# Patient Record
Sex: Female | Born: 1990 | Race: White | Hispanic: No | Marital: Single | Smoking: Former smoker
Health system: Southern US, Community
[De-identification: ages and names within clinical notes are randomized; demographics above are authoritative.]

## PROBLEM LIST (undated history)

## (undated) DIAGNOSIS — K529 Noninfective gastroenteritis and colitis, unspecified: Secondary | ICD-10-CM

## (undated) DIAGNOSIS — F410 Panic disorder [episodic paroxysmal anxiety] without agoraphobia: Secondary | ICD-10-CM

## (undated) DIAGNOSIS — K5190 Ulcerative colitis, unspecified, without complications: Secondary | ICD-10-CM

## (undated) DIAGNOSIS — A549 Gonococcal infection, unspecified: Secondary | ICD-10-CM

## (undated) DIAGNOSIS — F22 Delusional disorders: Secondary | ICD-10-CM

## (undated) DIAGNOSIS — F32A Depression, unspecified: Secondary | ICD-10-CM

## (undated) DIAGNOSIS — IMO0002 Reserved for concepts with insufficient information to code with codable children: Secondary | ICD-10-CM

## (undated) DIAGNOSIS — F329 Major depressive disorder, single episode, unspecified: Secondary | ICD-10-CM

## (undated) DIAGNOSIS — A749 Chlamydial infection, unspecified: Secondary | ICD-10-CM

## (undated) DIAGNOSIS — R87619 Unspecified abnormal cytological findings in specimens from cervix uteri: Secondary | ICD-10-CM

## (undated) DIAGNOSIS — R569 Unspecified convulsions: Secondary | ICD-10-CM

## (undated) DIAGNOSIS — B977 Papillomavirus as the cause of diseases classified elsewhere: Secondary | ICD-10-CM

## (undated) DIAGNOSIS — F1910 Other psychoactive substance abuse, uncomplicated: Secondary | ICD-10-CM

---
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
## 2009-03-04 DIAGNOSIS — K529 Noninfective gastroenteritis and colitis, unspecified: Secondary | ICD-10-CM

## 2009-03-04 HISTORY — DX: Noninfective gastroenteritis and colitis, unspecified: K52.9

## 2009-12-02 HISTORY — PX: COLONOSCOPY: SHX174

## 2010-05-16 ENCOUNTER — Emergency Department (HOSPITAL_COMMUNITY)
Admission: EM | Admit: 2010-05-16 | Discharge: 2010-05-17 | Disposition: A | Discharge status: Home / Self Care | Payer: Self-pay | Attending: Emergency Medicine | Admitting: Emergency Medicine

## 2010-05-16 DIAGNOSIS — F17200 Nicotine dependence, unspecified, uncomplicated: Secondary | ICD-10-CM | POA: Insufficient documentation

## 2010-05-16 DIAGNOSIS — Z3201 Encounter for pregnancy test, result positive: Secondary | ICD-10-CM | POA: Insufficient documentation

## 2010-05-17 LAB — POCT PREGNANCY, URINE: Preg Test, Ur: POSITIVE

## 2010-12-27 LAB — STREP B DNA PROBE: GBS: NEGATIVE

## 2011-01-28 ENCOUNTER — Inpatient Hospital Stay (HOSPITAL_COMMUNITY)
Admission: AD | Admit: 2011-01-28 | Discharge: 2011-01-31 | DRG: 774 | Disposition: A | Discharge status: Home / Self Care | Payer: Medicaid Other | Source: Ambulatory Visit | Attending: Obstetrics and Gynecology | Admitting: Obstetrics and Gynecology

## 2011-01-28 ENCOUNTER — Encounter (HOSPITAL_COMMUNITY): Payer: Self-pay | Admitting: *Deleted

## 2011-01-28 ENCOUNTER — Encounter (HOSPITAL_COMMUNITY): Payer: Self-pay | Admitting: Anesthesiology

## 2011-01-28 ENCOUNTER — Inpatient Hospital Stay (HOSPITAL_COMMUNITY): Payer: Medicaid Other | Admitting: Anesthesiology

## 2011-01-28 DIAGNOSIS — F1290 Cannabis use, unspecified, uncomplicated: Secondary | ICD-10-CM | POA: Diagnosis not present

## 2011-01-28 DIAGNOSIS — O480 Post-term pregnancy: Principal | ICD-10-CM | POA: Diagnosis present

## 2011-01-28 DIAGNOSIS — F1911 Other psychoactive substance abuse, in remission: Secondary | ICD-10-CM

## 2011-01-28 DIAGNOSIS — F4310 Post-traumatic stress disorder, unspecified: Secondary | ICD-10-CM | POA: Diagnosis present

## 2011-01-28 DIAGNOSIS — O365990 Maternal care for other known or suspected poor fetal growth, unspecified trimester, not applicable or unspecified: Secondary | ICD-10-CM | POA: Diagnosis present

## 2011-01-28 DIAGNOSIS — F32A Depression, unspecified: Secondary | ICD-10-CM | POA: Diagnosis not present

## 2011-01-28 DIAGNOSIS — F329 Major depressive disorder, single episode, unspecified: Secondary | ICD-10-CM | POA: Diagnosis not present

## 2011-01-28 DIAGNOSIS — F17210 Nicotine dependence, cigarettes, uncomplicated: Secondary | ICD-10-CM | POA: Diagnosis present

## 2011-01-28 DIAGNOSIS — Z915 Personal history of self-harm: Secondary | ICD-10-CM

## 2011-01-28 DIAGNOSIS — F419 Anxiety disorder, unspecified: Secondary | ICD-10-CM | POA: Diagnosis present

## 2011-01-28 DIAGNOSIS — Z9151 Personal history of suicidal behavior: Secondary | ICD-10-CM

## 2011-01-28 HISTORY — DX: Major depressive disorder, single episode, unspecified: F32.9

## 2011-01-28 HISTORY — DX: Papillomavirus as the cause of diseases classified elsewhere: B97.7

## 2011-01-28 HISTORY — DX: Reserved for concepts with insufficient information to code with codable children: IMO0002

## 2011-01-28 HISTORY — DX: Noninfective gastroenteritis and colitis, unspecified: K52.9

## 2011-01-28 HISTORY — DX: Unspecified abnormal cytological findings in specimens from cervix uteri: R87.619

## 2011-01-28 HISTORY — DX: Other psychoactive substance abuse, uncomplicated: F19.10

## 2011-01-28 HISTORY — DX: Delusional disorders: F22

## 2011-01-28 HISTORY — DX: Chlamydial infection, unspecified: A74.9

## 2011-01-28 HISTORY — DX: Unspecified convulsions: R56.9

## 2011-01-28 HISTORY — DX: Depression, unspecified: F32.A

## 2011-01-28 HISTORY — DX: Gonococcal infection, unspecified: A54.9

## 2011-01-28 HISTORY — DX: Panic disorder (episodic paroxysmal anxiety): F41.0

## 2011-01-28 LAB — COMPREHENSIVE METABOLIC PANEL
Alkaline Phosphatase: 176 U/L — ABNORMAL HIGH (ref 39–117)
BUN: 9 mg/dL (ref 6–23)
Chloride: 99 mEq/L (ref 96–112)
GFR calc Af Amer: >90 mL/min (ref >90)
Glucose, Bld: 85 mg/dL (ref 70–99)
Potassium: 3.7 mEq/L (ref 3.5–5.1)
Total Bilirubin: 0.5 mg/dL (ref 0.3–1.2)
Total Protein: 6.4 g/dL (ref 6.0–8.3)

## 2011-01-28 LAB — RAPID URINE DRUG SCREEN, HOSP PERFORMED
Benzodiazepines: NOT DETECTED
Cocaine: NOT DETECTED
Opiates: NOT DETECTED
Tetrahydrocannabinol: NOT DETECTED

## 2011-01-28 LAB — CBC
HCT: 34.6 % — ABNORMAL LOW (ref 36.0–46.0)
Hemoglobin: 12 g/dL (ref 12.0–15.0)
MCHC: 34.7 g/dL (ref 30.0–36.0)
MCV: 87.4 fL (ref 78.0–100.0)

## 2011-01-28 LAB — POCT FERN TEST: Fern Test: NEGATIVE

## 2011-01-28 LAB — LACTATE DEHYDROGENASE: LDH: 168 U/L (ref 94–250)

## 2011-01-28 LAB — RPR: RPR Ser Ql: NONREACTIVE

## 2011-01-28 MED ORDER — CITRIC ACID-SODIUM CITRATE 334-500 MG/5ML PO SOLN: 30.0000 mL | ORAL | Status: DC | PRN

## 2011-01-28 MED ORDER — PHENYLEPHRINE 40 MCG/ML (10ML) SYRINGE FOR IV PUSH (FOR BLOOD PRESSURE SUPPORT): 80.0000 ug | PREFILLED_SYRINGE | INTRAVENOUS | Status: DC | PRN

## 2011-01-28 MED ORDER — EPHEDRINE 5 MG/ML INJ
10.0000 mg | INTRAVENOUS | Status: DC | PRN
  Filled 2011-01-28: qty 4

## 2011-01-28 MED ORDER — OXYTOCIN 20 UNITS IN LACTATED RINGERS INFUSION - SIMPLE
1.0000 m[IU]/min | INTRAVENOUS | Status: DC
  Administered 2011-01-28: 1 m[IU]/min via INTRAVENOUS
  Filled 2011-01-28: qty 1000

## 2011-01-28 MED ORDER — LACTATED RINGERS IV SOLN
500.0000 mL | Freq: Once | INTRAVENOUS | Status: AC
  Administered 2011-01-28: 500 mL via INTRAVENOUS

## 2011-01-28 MED ORDER — LACTATED RINGERS IV SOLN
500.0000 mL | INTRAVENOUS | Status: DC | PRN
Start: 2011-01-28 — End: 2011-01-29

## 2011-01-28 MED ORDER — FLEET ENEMA 7-19 GM/118ML RE ENEM: 1.0000 | ENEMA | RECTAL | Status: DC | PRN

## 2011-01-28 MED ORDER — IBUPROFEN 600 MG PO TABS
600.0000 mg | ORAL_TABLET | Freq: Four times a day (QID) | ORAL | Status: DC | PRN
  Administered 2011-01-29: 600 mg via ORAL
  Filled 2011-01-28 (×6): qty 1

## 2011-01-28 MED ORDER — FENTANYL 2.5 MCG/ML BUPIVACAINE 1/10 % EPIDURAL INFUSION (WH - ANES)
14.0000 mL/h | INTRAMUSCULAR | Status: DC
  Administered 2011-01-28 (×3): 14 mL/h via EPIDURAL
  Filled 2011-01-28 (×3): qty 60

## 2011-01-28 MED ORDER — TERBUTALINE SULFATE 1 MG/ML IJ SOLN: 0.2500 mg | Freq: Once | INTRAMUSCULAR | Status: AC | PRN

## 2011-01-28 MED ORDER — LACTATED RINGERS IV SOLN
INTRAVENOUS | Status: DC
  Administered 2011-01-28: 125 mL/h via INTRAVENOUS
  Administered 2011-01-28: 21:00:00 via INTRAVENOUS
  Administered 2011-01-29: 300 mL via INTRAVENOUS

## 2011-01-28 MED ORDER — ACETAMINOPHEN 325 MG PO TABS
650.0000 mg | ORAL_TABLET | ORAL | Status: DC | PRN
  Administered 2011-01-29: 650 mg via ORAL
  Filled 2011-01-28: qty 2

## 2011-01-28 MED ORDER — LIDOCAINE HCL 1.5 % IJ SOLN
INTRAMUSCULAR | Status: DC | PRN
  Administered 2011-01-28 (×2): 5 mL via EPIDURAL

## 2011-01-28 MED ORDER — LIDOCAINE HCL (PF) 1 % IJ SOLN
30.0000 mL | INTRAMUSCULAR | Status: DC | PRN
  Filled 2011-01-28: qty 30

## 2011-01-28 MED ORDER — OXYTOCIN BOLUS FROM INFUSION
500.0000 mL | Freq: Once | INTRAVENOUS | Status: DC
  Filled 2011-01-28: qty 500

## 2011-01-28 MED ORDER — ZOLPIDEM TARTRATE 10 MG PO TABS: 10.0000 mg | ORAL_TABLET | Freq: Every evening | ORAL | Status: DC | PRN

## 2011-01-28 MED ORDER — EPHEDRINE 5 MG/ML INJ: 10.0000 mg | INTRAVENOUS | Status: DC | PRN

## 2011-01-28 MED ORDER — OXYCODONE-ACETAMINOPHEN 5-325 MG PO TABS: 2.0000 | ORAL_TABLET | ORAL | Status: DC | PRN

## 2011-01-28 MED ORDER — ONDANSETRON HCL 4 MG/2ML IJ SOLN
4.0000 mg | Freq: Four times a day (QID) | INTRAMUSCULAR | Status: DC | PRN
  Administered 2011-01-28: 4 mg via INTRAVENOUS
  Filled 2011-01-28: qty 2

## 2011-01-28 MED ORDER — PHENYLEPHRINE 40 MCG/ML (10ML) SYRINGE FOR IV PUSH (FOR BLOOD PRESSURE SUPPORT)
80.0000 ug | PREFILLED_SYRINGE | INTRAVENOUS | Status: DC | PRN
  Filled 2011-01-28: qty 5

## 2011-01-28 MED ORDER — DIPHENHYDRAMINE HCL 50 MG/ML IJ SOLN: 12.5000 mg | INTRAMUSCULAR | Status: DC | PRN

## 2011-01-28 MED ORDER — OXYTOCIN 20 UNITS IN LACTATED RINGERS INFUSION - SIMPLE: 125.0000 mL/h | Freq: Once | INTRAVENOUS | Status: DC

## 2011-01-28 MED ORDER — BUTORPHANOL TARTRATE 2 MG/ML IJ SOLN
1.0000 mg | INTRAMUSCULAR | Status: DC | PRN
  Administered 2011-01-28: 1 mg via INTRAVENOUS
  Filled 2011-01-28: qty 1

## 2011-01-28 NOTE — Anesthesia Procedure Notes (Signed)
Epidural Patient location during procedure: OB Start time: 01/28/2011 5:38 PM  Staffing Performed by: anesthesiologist   Preanesthetic Checklist Completed: patient identified, site marked, surgical consent, pre-op evaluation, timeout performed, IV checked, risks and benefits discussed and monitors and equipment checked  Epidural Patient position: sitting Prep: site prepped and draped and DuraPrep Patient monitoring: continuous pulse ox and blood pressure Approach: midline Injection technique: LOR air  Needle:  Needle type: Tuohy  Needle gauge: 17 G Needle length: 9 cm Needle insertion depth: 5 cm cm Catheter type: closed end flexible Catheter size: 19 Gauge Catheter at skin depth: 10 cm Test dose: negative  Assessment Events: blood not aspirated, injection not painful, no injection resistance, negative IV test and no paresthesia  Additional Notes Patient identified.  Risk benefits discussed including failed block, incomplete pain control, headache, nerve damage, paralysis, blood pressure changes, nausea, vomiting, reactions to medication both toxic or allergic, and postpartum back pain.  Patient expressed understanding and wished to proceed.  All questions were answered.  Sterile technique used throughout procedure and epidural site dressed with sterile barrier dressing. No paresthesia or other complications noted.The patient did not experience any signs of intravascular injection such as tinnitus or metallic taste in mouth nor signs of intrathecal spread such as rapid motor block. Please see nursing notes for vital signs.

## 2011-01-28 NOTE — Progress Notes (Signed)
  Subjective: Feeling some pressure with some contractions.  Partner still not here, but patient anticipates his arrival soon.  Objective: BP 105/71  Pulse 107  Temp(Src) 98.7 F (37.1 C) (Oral)  Resp 18  Ht 5' 5.34" (1.66 m)  Wt 67.132 kg (148 lb)  BMI 24.37 kg/m2  SpO2 100% I/O last 3 completed shifts: In: -  Out: 50 [Urine:50]    FHT:  FHR: 150s bpm, variability: moderate,  accelerations:  Present,  decelerations:  Present Occasional mild variable. UC:   regular, every 2 minutes, MVUs 190-225.  Pitocin on 1 mu/min SVE:   Dilation: Lip/rim Effacement (%): 100 Station: +2;+1 Exam by:: V. Lathema CNM Cervical rim on right No descent/reduction with trial push--patient not feeling much pressure at present.  Assessment / Plan: Progressing well--will await greater urge to push.  Nigel Bridgeman 01/28/2011, 10:52 PM
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
## 2011-01-28 NOTE — Progress Notes (Signed)
  Subjective: SROM at approx. 8:30p, with moderate MSF noted.  Variables with UCs.  No pain or pressure.  Objective: BP 98/44  Pulse 82  Temp(Src) 98 F (36.7 C) (Oral)  Resp 18  Ht 5' 5.34" (1.66 m)  Wt 67.132 kg (148 lb)  BMI 24.37 kg/m2  SpO2 100% I/O last 3 completed shifts: In: -  Out: 50 [Urine:50]    FHT:  FHR: 140s bpm, variability: moderate,  accelerations:  Present,  decelerations:  Present Mild variables with UCs.  Accelerations noted with scalp stim.  UC:   regular, every 2 minutes SVE:   7-8 cm, 100%, vtx +1 station Leaking moderate MSF  IUPC and FSE applied  Pitocin on 6 mu/min  Labs: Lab Results  Component Value Date   WBC 13.4* 01/28/2011   HGB 12.0 01/28/2011   HCT 34.6* 01/28/2011   MCV 87.4 01/28/2011   PLT 235 01/28/2011    Assessment / Plan: Augmentation of labor, progressing well Series of variable decels  Will observe tracing--if variables persist, will plan amnioinfusion. Pitocin down to 3 mu/min.  Joanne Newman 01/28/2011, 9:05 PM
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
## 2011-01-28 NOTE — Progress Notes (Signed)
  Subjective: Comfortable after epidural.  Had received IV Stadol with minimal benefit.  Objective: BP 117/79  Pulse 91  Temp(Src) 98 F (36.7 C) (Oral)  Resp 18  Ht 5' 5.34" (1.66 m)  Wt 67.132 kg (148 lb)  BMI 24.37 kg/m2  SpO2 100% I/O last 3 completed shifts: In: -  Out: 50 [Urine:50]    FHT:  FHR: 140s bpm, variability: moderate,  accelerations:  Present,  decelerations:  Absent UC:   regular, every 3-4 minutes SVE:   Dilation: 6 Effacement (%): 80 Station: -1 Exam by:: Manfred Arch, CNM Pitocin on 6 mu/min  Labs: Lab Results  Component Value Date   WBC 13.4* 01/28/2011   HGB 12.0 01/28/2011   HCT 34.6* 01/28/2011   MCV 87.4 01/28/2011   PLT 235 01/28/2011    Assessment / Plan: Augmentation of labor, progressing well Category 1 FHR  FOB not present--patient advises he is working till 10 pm. Will defer AROM until he arrives.  Advised patient SROM may occur, with natural labor progression as potential outcome.  Patient agreeable with plan.   Nigel Bridgeman 01/28/2011, 7:04 PM

## 2011-01-28 NOTE — Progress Notes (Signed)
Leaking first noted at 0830, blood tinged... Dingy/clear. Continues to leak, lower abd cramping since

## 2011-01-29 ENCOUNTER — Encounter (HOSPITAL_COMMUNITY): Payer: Self-pay | Admitting: *Deleted

## 2011-01-29 DIAGNOSIS — F17210 Nicotine dependence, cigarettes, uncomplicated: Secondary | ICD-10-CM | POA: Diagnosis present

## 2011-01-29 DIAGNOSIS — F4310 Post-traumatic stress disorder, unspecified: Secondary | ICD-10-CM | POA: Diagnosis present

## 2011-01-29 DIAGNOSIS — Z9151 Personal history of suicidal behavior: Secondary | ICD-10-CM

## 2011-01-29 DIAGNOSIS — F419 Anxiety disorder, unspecified: Secondary | ICD-10-CM | POA: Diagnosis present

## 2011-01-29 DIAGNOSIS — F329 Major depressive disorder, single episode, unspecified: Secondary | ICD-10-CM | POA: Diagnosis not present

## 2011-01-29 DIAGNOSIS — F1290 Cannabis use, unspecified, uncomplicated: Secondary | ICD-10-CM | POA: Diagnosis not present

## 2011-01-29 DIAGNOSIS — Z915 Personal history of self-harm: Secondary | ICD-10-CM

## 2011-01-29 MED ORDER — KETOROLAC TROMETHAMINE 30 MG/ML IJ SOLN: 30.0000 mg | Freq: Four times a day (QID) | INTRAMUSCULAR | Status: DC | PRN

## 2011-01-29 MED ORDER — METHYLERGONOVINE MALEATE 0.2 MG/ML IJ SOLN: 0.2000 mg | INTRAMUSCULAR | Status: DC | PRN

## 2011-01-29 MED ORDER — MAGNESIUM HYDROXIDE 400 MG/5ML PO SUSP: 30.0000 mL | ORAL | Status: DC | PRN

## 2011-01-29 MED ORDER — ZOLPIDEM TARTRATE 5 MG PO TABS: 5.0000 mg | ORAL_TABLET | Freq: Every evening | ORAL | Status: DC | PRN

## 2011-01-29 MED ORDER — IBUPROFEN 600 MG PO TABS
600.0000 mg | ORAL_TABLET | Freq: Four times a day (QID) | ORAL | Status: DC
  Administered 2011-01-29 – 2011-01-31 (×7): 600 mg via ORAL
  Filled 2011-01-29 (×2): qty 1

## 2011-01-29 MED ORDER — TRAMADOL HCL 50 MG PO TABS
100.0000 mg | ORAL_TABLET | Freq: Four times a day (QID) | ORAL | Status: DC | PRN
  Administered 2011-01-29 – 2011-01-30 (×2): 100 mg via ORAL
  Filled 2011-01-29 (×2): qty 2

## 2011-01-29 MED ORDER — LANOLIN HYDROUS EX OINT: TOPICAL_OINTMENT | CUTANEOUS | Status: DC | PRN

## 2011-01-29 MED ORDER — ONDANSETRON HCL 4 MG/2ML IJ SOLN: 4.0000 mg | INTRAMUSCULAR | Status: DC | PRN

## 2011-01-29 MED ORDER — ONDANSETRON HCL 4 MG PO TABS: 4.0000 mg | ORAL_TABLET | ORAL | Status: DC | PRN

## 2011-01-29 MED ORDER — WITCH HAZEL-GLYCERIN EX PADS: 1.0000 "application " | MEDICATED_PAD | CUTANEOUS | Status: DC | PRN

## 2011-01-29 MED ORDER — PRENATAL PLUS 27-1 MG PO TABS
1.0000 | ORAL_TABLET | Freq: Every day | ORAL | Status: DC
  Administered 2011-01-30 – 2011-01-31 (×2): 1 via ORAL

## 2011-01-29 MED ORDER — DIPHENHYDRAMINE HCL 25 MG PO CAPS: 25.0000 mg | ORAL_CAPSULE | Freq: Four times a day (QID) | ORAL | Status: DC | PRN

## 2011-01-29 MED ORDER — BENZOCAINE-MENTHOL 20-0.5 % EX AERO
INHALATION_SPRAY | CUTANEOUS | Status: AC
  Filled 2011-01-29: qty 56

## 2011-01-29 MED ORDER — MEDROXYPROGESTERONE ACETATE 150 MG/ML IM SUSP: 150.0000 mg | INTRAMUSCULAR | Status: DC | PRN

## 2011-01-29 MED ORDER — OXYCODONE-ACETAMINOPHEN 5-325 MG PO TABS
1.0000 | ORAL_TABLET | Freq: Once | ORAL | Status: AC
  Administered 2011-01-29: 1 via ORAL
  Filled 2011-01-29: qty 1

## 2011-01-29 MED ORDER — PRENATAL PLUS 27-1 MG PO TABS
1.0000 | ORAL_TABLET | Freq: Every day | ORAL | Status: DC
  Administered 2011-01-29 – 2011-01-30 (×2): 1 via ORAL
  Filled 2011-01-29 (×5): qty 1

## 2011-01-29 MED ORDER — BENZOCAINE-MENTHOL 20-0.5 % EX AERO
1.0000 "application " | INHALATION_SPRAY | CUTANEOUS | Status: DC | PRN
  Administered 2011-01-29: 1 via TOPICAL

## 2011-01-29 MED ORDER — SIMETHICONE 80 MG PO CHEW: 80.0000 mg | CHEWABLE_TABLET | ORAL | Status: DC | PRN

## 2011-01-29 MED ORDER — SENNOSIDES-DOCUSATE SODIUM 8.6-50 MG PO TABS
2.0000 | ORAL_TABLET | Freq: Every day | ORAL | Status: DC
  Administered 2011-01-29 – 2011-01-30 (×2): 2 via ORAL

## 2011-01-29 MED ORDER — BENZOCAINE-MENTHOL 20-0.5 % EX AERO
INHALATION_SPRAY | CUTANEOUS | Status: AC
  Administered 2011-01-29: 1 via TOPICAL
  Filled 2011-01-29: qty 56

## 2011-01-29 MED ORDER — DIBUCAINE 1 % RE OINT: 1.0000 "application " | TOPICAL_OINTMENT | RECTAL | Status: DC | PRN

## 2011-01-29 MED ORDER — METHYLERGONOVINE MALEATE 0.2 MG PO TABS: 0.2000 mg | ORAL_TABLET | ORAL | Status: DC | PRN

## 2011-01-29 MED ORDER — TETANUS-DIPHTH-ACELL PERTUSSIS 5-2.5-18.5 LF-MCG/0.5 IM SUSP
0.5000 mL | Freq: Once | INTRAMUSCULAR | Status: AC
  Administered 2011-01-30: 0.5 mL via INTRAMUSCULAR
  Filled 2011-01-29: qty 0.5

## 2011-01-29 NOTE — Anesthesia Postprocedure Evaluation (Signed)
Anesthesia Post Note  Patient: Joanne Newman  Procedure(s) Performed: * No procedures listed *  Anesthesia type: Epidural  Patient location: Mother/Baby  Post pain: Pain level controlled  Post assessment: Post-op Vital signs reviewed  Last Vitals:  Filed Vitals:   01/29/11 0900  BP: 112/71  Pulse: 81  Temp: 36.6 C  Resp: 18    Post vital signs: Reviewed  Level of consciousness: awake  Complications: No apparent anesthesia complications

## 2011-01-29 NOTE — Progress Notes (Signed)
Called by RN requesting stronger medication for uterine cramping while nursing.  Pt last had iburprofen at 12noon.  She has no narcotic medication ordered presumably because of substance abuse history. Plan:   Single dose Percocet now. Begin Toradol 30 mg IV every 6 hours starting 6 hours after last ibuprofen. Both orders placed in computer.

## 2011-01-29 NOTE — Progress Notes (Signed)
Patient ID: Joanne Newman, female   DOB: Sep 23, 1990, 20 y.o.   MRN: [id number] Post Partum Day 0 Subjective: no complaints, up ad lib without syncope, voiding, tolerating PO, + flatus  Pain well controlled with po meds, has some mild cramping BF initiated  Mood stable, bonding well, baby currently skin-skin   Objective: Blood pressure 112/71, pulse 81, temperature 97.9 F (36.6 C), temperature source Oral, resp. rate 18, height 5' 5.34" (1.66 m), weight 67.132 kg (148 lb), SpO2 98.00%, unknown if currently breastfeeding.  Physical Exam:  General: alert and no distress Lungs: CTAB Heart: RRR Breasts: soft, nipples intact Lochia: appropriate Uterine Fundus: firm Perineum: wnl DVT Evaluation: No evidence of DVT seen on physical exam. Negative Homan's sign. No significant calf/ankle edema.   Basename 01/28/11 1233  HGB 12.0  HCT 34.6*    Assessment/Plan: Breastfeeding, Lactation consult, Social Work consult and Contraception pt declines any contraception, emphasized risk of pregnancy states she will practice abstinece       LOS: 1 day   Joanne Newman M 01/29/2011, 10:40 AM

## 2011-01-29 NOTE — Progress Notes (Signed)
Delivery Note Pt was C/C/+2 at 1150.  Pushed w/ 2 ctxs and no progress w/ pushes, so labored down until just before 0130.  Once pushing resumed, pt's temp noted to be 100.2 axillary.  Given IVF bolus, along with po Tylenol, however, within a few minutes of taking, pt threw up.  Pushed well thereafter (left side-lying), and at 1:48 AM a viable female "Hessie Diener" was delivered via Vaginal, Spontaneous Delivery (Presentation:LOA ;  ).  APGAR: 9, 9; weight 7 lb 6.3 oz (3355 g).   Placenta status: Intact, Spontaneous.  Cord: 3 vessels with the following complications: None.  Cord pH: n/a Newborn w/ occult cord at neck, and body cord.  Spontaneous cry as delivered and placed on mom's abdomen.  FOB was present and cut cord.   Anesthesia: Epidural  Episiotomy: None Lacerations: hemostatic bilateral superficial labial Suture Repair: n/a Est. Blood Loss (mL):  Mom to postpartum.  Baby to nursery-stable. Pt plans to breastfeed.  Will CTO mom's temp.  Plan PP SW consult.   Ilaria Much H 01/29/2011, 2:12 AM

## 2011-01-29 NOTE — Progress Notes (Signed)
UR chart review completed.  

## 2011-01-30 LAB — CBC
Hemoglobin: 10.9 g/dL — ABNORMAL LOW (ref 12.0–15.0)
MCH: 29.8 pg (ref 26.0–34.0)
MCV: 88.8 fL (ref 78.0–100.0)
RBC: 3.66 MIL/uL — ABNORMAL LOW (ref 3.87–5.11)

## 2011-01-30 MED ORDER — MEASLES, MUMPS & RUBELLA VAC ~~LOC~~ INJ
0.5000 mL | INJECTION | Freq: Once | SUBCUTANEOUS | Status: AC
  Administered 2011-01-31: 0.5 mL via SUBCUTANEOUS
  Filled 2011-01-30 (×2): qty 0.5
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
## 2011-01-30 NOTE — Progress Notes (Addendum)
Post Partum Day 1 Subjective: up ad lib and voiding  Objective: Blood pressure 118/65, pulse 86, temperature 98.1 F (36.7 C), temperature source Oral, resp. rate 18, height 5' 5.34" (1.66 m), weight 67.132 kg (148 lb), SpO2 98.00%, unknown if currently breastfeeding.  Physical Exam:  General: alert Lochia: small serosa Uterine Fundus: WNL per nurse exam Incision: na DVT Evaluation: - Homans sign bilaterally   Basename 01/30/11 0540 01/28/11 1233  HGB 10.9* 12.0  HCT 32.5* 34.6*    Assessment/Plan: Plan for discharge tomorrow, Breastfeeding, Social Work consult and Contraception plans condoms   LOS: 2 days   Joanne Newman 01/30/2011, 10:33 AM

## 2011-01-31 DIAGNOSIS — F1911 Other psychoactive substance abuse, in remission: Secondary | ICD-10-CM

## 2011-01-31 MED ORDER — IBUPROFEN 600 MG PO TABS: 600.0000 mg | ORAL_TABLET | Freq: Four times a day (QID) | ORAL | Status: AC | PRN

## 2011-01-31 NOTE — Discharge Summary (Signed)
   Obstetric Discharge Summary Reason for Admission: onset of labor and post dates Prenatal Procedures: NST and ultrasound Intrapartum Procedures: spontaneous vaginal delivery and epidural Postpartum Procedures: none Complications-Operative and Postpartum: none  Temp:  [98.1 F (36.7 C)-98.2 F (36.8 C)] 98.2 F (36.8 C) (11/29 0600) Pulse Rate:  [67-80] 67  (11/29 0600) Resp:  [18] 18  (11/29 0600) BP: (115-118)/(65-77) 115/73 mmHg (11/29 0600) Hemoglobin  Date Value Range Status  01/30/2011 10.9* 12.0-15.0 (g/dL) Final     HCT  Date Value Range Status  01/30/2011 32.5* 36.0-46.0 (%) Final    Hospital Course:  Pt arrived in early labor and postdates, She had some initial borderline BP, with no PIH sx's, PIH labs were normal.  She was admitted and started on pitocin, received 1 dose of stadol and then received an epidural. She progressed, and SROM with MSF and had variables, overall FHR remained reassuring and had SVD, there were bilateral superficial labial lacerations that were hemostatic and therefore not repaired. She then had a normal PP course, with a SW consult and deemed stable. An initial UDS performed on arrival was negative.   Discharge Diagnoses: Term Pregnancy-delivered  Discharge Information: Date: 01/31/2011 Activity: pelvic rest Diet: routine Medications:  Medication List  As of 01/31/2011  8:54 AM   START taking these medications         ibuprofen 600 MG tablet   Commonly known as: ADVIL,MOTRIN   Take 1 tablet (600 mg total) by mouth every 6 (six) hours as needed for pain (pain scale < 4).         CONTINUE taking these medications         prenatal vitamin w/FE, FA 27-1 MG Tabs          Where to get your medications    These are the prescriptions that you need to pick up.   You may get these medications from any pharmacy.         ibuprofen 600 MG tablet           Condition: stable Instructions: refer to practice specific booklet Discharge  to: home Follow-up Information    Follow up with Janine Limbo, MD in 6 weeks. (As needed if symptoms worsen)    Contact information:   [address] [place] [address] [phone]          Newborn Data: Live born  Information for the patient's newborn:  Auriel, Kist Girl Kween [[id number]]  female ; APGAR , 9, 9 ; weight ; 7#6oz  Home with mother.  Delmas Faucett M 01/31/2011, 8:54 AM

## 2011-01-31 NOTE — Progress Notes (Signed)
Patient ID: Joanne Newman, female   DOB: [birth year], 20 y.o.   MRN: [id number] Post Partum Day 2 Subjective: no complaints, up ad lib without syncope, voiding, tolerating PO, + flatus, no BM Pain well controlled with po meds, had to have a dose of percocet and switched to toradol for increased cramping, better today, c/o feeling vaginal soreness BF well, cluster feeding still Mood stable, no SI/HI bonding well   Objective: Blood pressure 115/73, pulse 67, temperature 98.2 F (36.8 C), temperature source Oral, resp. rate 18, height 5' 5.34" (1.66 m), weight 67.132 kg (148 lb), SpO2 98.00%, unknown if currently breastfeeding.  Physical Exam:  General: alert, fatigued and no distress Lungs: CTAB Heart: RRR Breasts: filling, nipples intact Lochia: appropriate Uterine Fundus: firm Perineum: WNL, slightly swolen DVT Evaluation: No evidence of DVT seen on physical exam. Negative Homan's sign. No significant calf/ankle edema.   Basename 01/30/11 0540 01/28/11 1233  HGB 10.9* 12.0  HCT 32.5* 34.6*    Assessment/Plan: Discharge home, Breastfeeding, Lactation consult and Contraception condoms and abstinence, again reinforced risk of pregnancy       LOS: 3 days   Cline Draheim M 01/31/2011, 9:03 AM

## 2011-10-19 ENCOUNTER — Emergency Department (HOSPITAL_COMMUNITY)
Admission: EM | Admit: 2011-10-19 | Discharge: 2011-10-19 | Disposition: A | Discharge status: Home / Self Care | Payer: Self-pay | Attending: Emergency Medicine | Admitting: Emergency Medicine

## 2011-10-19 ENCOUNTER — Encounter (HOSPITAL_COMMUNITY): Payer: Self-pay | Admitting: *Deleted

## 2011-10-19 DIAGNOSIS — N390 Urinary tract infection, site not specified: Secondary | ICD-10-CM | POA: Insufficient documentation

## 2011-10-19 DIAGNOSIS — Z87891 Personal history of nicotine dependence: Secondary | ICD-10-CM | POA: Insufficient documentation

## 2011-10-19 LAB — URINALYSIS, ROUTINE W REFLEX MICROSCOPIC
Bilirubin Urine: NEGATIVE
Glucose, UA: NEGATIVE mg/dL
Hgb urine dipstick: NEGATIVE
Ketones, ur: NEGATIVE mg/dL
pH: 5.5 (ref 5.0–8.0)

## 2011-10-19 LAB — URINE MICROSCOPIC-ADD ON

## 2011-10-19 MED ORDER — NITROFURANTOIN MONOHYD MACRO 100 MG PO CAPS: 100.0000 mg | ORAL_CAPSULE | Freq: Two times a day (BID) | ORAL | Status: AC

## 2011-10-19 MED ORDER — PHENAZOPYRIDINE HCL 200 MG PO TABS: 200.0000 mg | ORAL_TABLET | Freq: Three times a day (TID) | ORAL | Status: AC

## 2011-10-19 NOTE — ED Notes (Signed)
Patient is breastfeeding

## 2011-10-19 NOTE — ED Provider Notes (Signed)
History     CSN: [id number]  Arrival date & time 10/19/11  1531   First MD Initiated Contact with Patient 10/19/11 [phone]      Chief Complaint  Patient presents with  . Urinary Frequency  . Dysuria    (Consider location/radiation/quality/duration/timing/severity/associated sxs/prior treatment) HPI  Patient presents to the ER with complaints of urinary urgency and frequency. The symptoms have been consistent for 3-4 days . She denies having any abdominal pains of back pains, vaginal discharge, hematuria, diarrhea, fevers, chills, nausea, vomiting. She admits to having had UTIs in the past. VSS and NAD. She is allergic to keflex and penicillins, she also has a approx 34 month old that she is breast feeding.  Past Medical History  Diagnosis Date  . Seizures   . Abnormal Pap smear   . HPV in female   . Chlamydia 2010  . Gonorrhea 2010  . Depression   . Paranoia   . Panic attacks   . Colitis 03-2009  . Substance abuse   . Cystitis     Past Surgical History  Procedure Date  . Colonoscopy 12-2009    Family History  Problem Relation Age of Onset  . Anesthesia problems Neg Hx     History  Substance Use Topics  . Smoking status: Former Smoker -- 0.5 packs/day for 10 years    Types: Cigarettes    Quit date: 07/28/2010  . Smokeless tobacco: Never Used  . Alcohol Use: Yes     3-4 per week    OB History    Grav Para Term Preterm Abortions TAB SAB Ect Mult Living   3 1 1  0 2 0 2 0 0 1      Review of Systems   HEENT: denies blurry vision or change in hearing PULMONARY: Denies difficulty breathing and SOB CARDIAC: denies chest pain or heart palpitations MUSCULOSKELETAL:  denies being unable to ambulate ABDOMEN AL: denies abdominal pain GU: denies loss of bowel or urinary control NEURO: denies numbness and tingling in extremities SKIN: no new rashes PSYCH: patient denies anxiety or depression. NECK: Pt denies having neck pain     Allergies  Cephalexin; Cheese  flavor; Penicillins; Vicodin; and Latex  Home Medications   Current Outpatient Rx  Name Route Sig Dispense Refill  . NITROFURANTOIN MONOHYD MACRO 100 MG PO CAPS Oral Take 1 capsule (100 mg total) by mouth 2 (two) times daily. 10 capsule 0  . PHENAZOPYRIDINE HCL 200 MG PO TABS Oral Take 1 tablet (200 mg total) by mouth 3 (three) times daily. 6 tablet 0    BP 115/61  Pulse 107  Temp 97.9 F (36.6 C) (Oral)  Resp 20  SpO2 98%  Breastfeeding? Yes  Physical Exam  Constitutional: She appears well-developed and well-nourished.  HENT:  Head: Normocephalic and atraumatic.  Eyes: Conjunctivae are normal.  Neck: Trachea normal, normal range of motion and full passive range of motion without pain. Neck supple.  Cardiovascular: Normal rate, regular rhythm and normal pulses.   Pulmonary/Chest: Effort normal. Chest wall is not dull to percussion. She exhibits no crepitus, no edema, no deformity and no retraction.  Abdominal: Soft. Normal appearance.  Neurological: She is alert. She has normal strength.  Skin: Skin is warm, dry and intact.  Psychiatric: She has a normal mood and affect.    ED Course  Procedures (including critical care time)  Labs Reviewed  URINALYSIS, ROUTINE W REFLEX MICROSCOPIC - Abnormal; Notable for the following:    APPearance CLOUDY (*)  Protein, ur 30 (*)     Leukocytes, UA SMALL (*)     All other components within normal limits  URINE MICROSCOPIC-ADD ON - Abnormal; Notable for the following:    Squamous Epithelial / LPF FEW (*)     All other components within normal limits  POCT PREGNANCY, URINE   No results found.   1. UTI (lower urinary tract infection)       MDM   Pts urine has WBC and leucokytes. Rx for Macrobid given as well as Pyridium.  Pt has been advised of the symptoms that warrant their return to the ED. Patient has voiced understanding and has agreed to follow-up with the PCP or specialist.         Dorthula Matas,  PA 10/19/11 1649

## 2011-10-19 NOTE — ED Provider Notes (Signed)
Medical screening examination/treatment/procedure(s) were performed by non-physician practitioner and as supervising physician I was immediately available for consultation/collaboration.   Benny Lennert, MD 10/19/11 1723

## 2011-10-19 NOTE — ED Notes (Signed)
Reports burning pain with urination x 3-4 days and frequent urination. No acute distress noted at triage.

## 2011-12-15 ENCOUNTER — Encounter (HOSPITAL_COMMUNITY): Payer: Self-pay | Admitting: Emergency Medicine

## 2011-12-15 ENCOUNTER — Emergency Department (HOSPITAL_COMMUNITY)
Admission: EM | Admit: 2011-12-15 | Discharge: 2011-12-15 | Disposition: A | Discharge status: Home / Self Care | Payer: Self-pay | Attending: Emergency Medicine | Admitting: Emergency Medicine

## 2011-12-15 DIAGNOSIS — N760 Acute vaginitis: Secondary | ICD-10-CM | POA: Insufficient documentation

## 2011-12-15 DIAGNOSIS — B9689 Other specified bacterial agents as the cause of diseases classified elsewhere: Secondary | ICD-10-CM | POA: Insufficient documentation

## 2011-12-15 DIAGNOSIS — F329 Major depressive disorder, single episode, unspecified: Secondary | ICD-10-CM | POA: Insufficient documentation

## 2011-12-15 DIAGNOSIS — F3289 Other specified depressive episodes: Secondary | ICD-10-CM | POA: Insufficient documentation

## 2011-12-15 DIAGNOSIS — F1910 Other psychoactive substance abuse, uncomplicated: Secondary | ICD-10-CM | POA: Insufficient documentation

## 2011-12-15 DIAGNOSIS — F22 Delusional disorders: Secondary | ICD-10-CM | POA: Insufficient documentation

## 2011-12-15 DIAGNOSIS — F410 Panic disorder [episodic paroxysmal anxiety] without agoraphobia: Secondary | ICD-10-CM | POA: Insufficient documentation

## 2011-12-15 DIAGNOSIS — N390 Urinary tract infection, site not specified: Secondary | ICD-10-CM | POA: Insufficient documentation

## 2011-12-15 DIAGNOSIS — Z888 Allergy status to other drugs, medicaments and biological substances status: Secondary | ICD-10-CM | POA: Insufficient documentation

## 2011-12-15 DIAGNOSIS — B977 Papillomavirus as the cause of diseases classified elsewhere: Secondary | ICD-10-CM | POA: Insufficient documentation

## 2011-12-15 DIAGNOSIS — A499 Bacterial infection, unspecified: Secondary | ICD-10-CM | POA: Insufficient documentation

## 2011-12-15 DIAGNOSIS — Z87891 Personal history of nicotine dependence: Secondary | ICD-10-CM | POA: Insufficient documentation

## 2011-12-15 DIAGNOSIS — Z91040 Latex allergy status: Secondary | ICD-10-CM | POA: Insufficient documentation

## 2011-12-15 LAB — URINALYSIS, ROUTINE W REFLEX MICROSCOPIC
Bilirubin Urine: NEGATIVE
Ketones, ur: NEGATIVE mg/dL
Nitrite: POSITIVE — AB
pH: 5.5 (ref 5.0–8.0)

## 2011-12-15 LAB — URINE MICROSCOPIC-ADD ON

## 2011-12-15 LAB — WET PREP, GENITAL: Trich, Wet Prep: NONE SEEN

## 2011-12-15 MED ORDER — METRONIDAZOLE 500 MG PO TABS
500.0000 mg | ORAL_TABLET | Freq: Once | ORAL | Status: AC
  Administered 2011-12-15: 500 mg via ORAL
  Filled 2011-12-15: qty 1

## 2011-12-15 MED ORDER — SULFAMETHOXAZOLE-TRIMETHOPRIM 800-160 MG PO TABS: 1.0000 | ORAL_TABLET | Freq: Two times a day (BID) | ORAL | Status: DC

## 2011-12-15 MED ORDER — METRONIDAZOLE 500 MG PO TABS: 500.0000 mg | ORAL_TABLET | Freq: Two times a day (BID) | ORAL | Status: DC

## 2011-12-15 MED ORDER — SULFAMETHOXAZOLE-TMP DS 800-160 MG PO TABS
1.0000 | ORAL_TABLET | Freq: Once | ORAL | Status: AC
  Administered 2011-12-15: 1 via ORAL
  Filled 2011-12-15: qty 1

## 2011-12-15 NOTE — ED Notes (Signed)
Onset today 0200 right lower back pain radiating to RLQ and headache 8/10 achy dull with nausea.  Ax4 answering and following commands appropriate.

## 2011-12-15 NOTE — ED Provider Notes (Signed)
History     CSN: [id number]  Arrival date & time 12/15/11  1478   First MD Initiated Contact with Patient 12/15/11 0920      No chief complaint on file.   (Consider location/radiation/quality/duration/timing/severity/associated sxs/prior treatment) HPI  21 year old female with history of UTI presents complaining of fever and abdominal pain. Patient reports she has just gotten over a urinary tract infection. This morning she was awoke with crampy lower abdominal pain. Pain is to the suprapubic region which wraps around to her low back. Pain has been persistence. She endorses nausea and headache but denies vomiting, diarrhea, or constipation. Patient was subjective fever without chills. NOthing makes pain better or worse. No chest pain or shortness of breath. No vaginal discharge or hematuria noted. She does complain of some burning on urination. She is currently breast-feeding. Does have prior history of STD, but states he has not had any sexual intercourse for the past 2 weeks.  Past Medical History  Diagnosis Date  . Seizures   . Abnormal Pap smear   . HPV in female   . Chlamydia 2010  . Gonorrhea 2010  . Depression   . Paranoia   . Panic attacks   . Colitis 03-2009  . Substance abuse   . Cystitis     Past Surgical History  Procedure Date  . Colonoscopy 12-2009    Family History  Problem Relation Age of Onset  . Anesthesia problems Neg Hx     History  Substance Use Topics  . Smoking status: Former Smoker -- 0.5 packs/day for 10 years    Types: Cigarettes    Quit date: 07/28/2010  . Smokeless tobacco: Never Used  . Alcohol Use: Yes     3-4 per week    OB History    Grav Para Term Preterm Abortions TAB SAB Ect Mult Living   3 1 1  0 2 0 2 0 0 1      Review of Systems  All other systems reviewed and are negative.    Allergies  Cephalexin; Cheese flavor; Penicillins; Vicodin; and Latex  Home Medications  No current outpatient prescriptions on  file.  Breastfeeding? Yes  Physical Exam  Nursing note and vitals reviewed. Constitutional: She is oriented to person, place, and time. She appears well-developed and well-nourished. No distress.       Appears nontoxic  HENT:  Head: Normocephalic and atraumatic.  Eyes: Conjunctivae normal are normal.  Neck: Normal range of motion. Neck supple.       No nuchal rigidity  Cardiovascular: Normal rate and regular rhythm.   Pulmonary/Chest: Effort normal and breath sounds normal. She exhibits no tenderness.  Abdominal: Soft. There is tenderness (suprapubic tenderness without guarding or rebound.  no hernia).       No CVA tenderness  Genitourinary: Vagina normal and uterus normal. There is no rash or lesion on the right labia. There is no rash or lesion on the left labia. Cervix exhibits no motion tenderness and no discharge. Right adnexum displays no mass and no tenderness. Left adnexum displays no mass and no tenderness. No erythema, tenderness or bleeding around the vagina. No vaginal discharge found.       Chaperone present  Musculoskeletal:       Cervical back: Normal.       Thoracic back: Normal.       Lumbar back: Normal.       Back:  Lymphadenopathy:       Right: No inguinal adenopathy present.  Left: No inguinal adenopathy present.  Neurological: She is alert and oriented to person, place, and time.  Skin: Skin is warm. No rash noted.    ED Course  Procedures (including critical care time)  Results for orders placed during the hospital encounter of 12/15/11  URINALYSIS, ROUTINE W REFLEX MICROSCOPIC      Component Value Range   Color, Urine YELLOW  YELLOW   APPearance CLOUDY (*) CLEAR   Specific Gravity, Urine 1.017  1.005 - 1.030   pH 5.5  5.0 - 8.0   Glucose, UA NEGATIVE  NEGATIVE mg/dL   Hgb urine dipstick LARGE (*) NEGATIVE   Bilirubin Urine NEGATIVE  NEGATIVE   Ketones, ur NEGATIVE  NEGATIVE mg/dL   Protein, ur >409 (*) NEGATIVE mg/dL   Urobilinogen, UA 0.2   0.0 - 1.0 mg/dL   Nitrite POSITIVE (*) NEGATIVE   Leukocytes, UA LARGE (*) NEGATIVE  PREGNANCY, URINE      Component Value Range   Preg Test, Ur NEGATIVE  NEGATIVE  WET PREP, GENITAL      Component Value Range   Yeast Wet Prep HPF POC NONE SEEN  NONE SEEN   Trich, Wet Prep NONE SEEN  NONE SEEN   Clue Cells Wet Prep HPF POC MANY (*) NONE SEEN   WBC, Wet Prep HPF POC RARE (*) NONE SEEN  URINE MICROSCOPIC-ADD ON      Component Value Range   WBC, UA TOO NUMEROUS TO COUNT  <3 WBC/hpf   RBC / HPF 11-20  <3 RBC/hpf   Bacteria, UA FEW (*) RARE   No results found.  1. UTI 2. BV   MDM  Pt with urinary sxs, and lower back pain.  Was diagnosed with UTI 1 month ago and was taking nitrofurantoin.  She has UA consistent with UTI today.  No systemic infection (no fever, vomiting).  Pelvic exam neg for PID as she has no CMT.  Pt is breast feeding, however according to AAP, pt can take bactrim as treatment for UTI.  Will prescribe bactrim. She has evidence of BV, flagyl prescribed.  Urine culture sent.  Pt stable to be d/c.   Doubt appendicitis.  Pt appears nontoxic.  I also recommended pump and dump while taking abx.  I discussed with Dr. Radford Pax in regard to abx choice, who agrees with plan.    BP 115/64  Pulse 111  Temp 99.2 F (37.3 C) (Oral)  Resp 20  SpO2 99%  Breastfeeding? Yes  Nursing notes reviewed and considered in documentation  Previous records reviewed and considered  All labs/vitals reviewed and considered  xrays reviewed and considered         Fayrene Helper, PA-C 12/15/11 1118

## 2011-12-15 NOTE — ED Provider Notes (Signed)
Medical screening examination/treatment/procedure(s) were performed by non-physician practitioner and as supervising physician I was immediately available for consultation/collaboration.   Charles B. Bernette Mayers, MD 12/15/11 1125

## 2011-12-17 LAB — GC/CHLAMYDIA PROBE AMP, GENITAL
Chlamydia, DNA Probe: NEGATIVE
GC Probe Amp, Genital: NEGATIVE

## 2011-12-17 LAB — URINE CULTURE: Colony Count: >=100000

## 2011-12-18 NOTE — ED Notes (Signed)
+   urine  Patient treated appropriately -sensitive to same-chart appended per protocol MD.  

## 2012-01-20 ENCOUNTER — Encounter (HOSPITAL_COMMUNITY): Payer: Self-pay | Admitting: Emergency Medicine

## 2012-01-20 ENCOUNTER — Emergency Department (HOSPITAL_COMMUNITY)
Admission: EM | Admit: 2012-01-20 | Discharge: 2012-01-20 | Disposition: A | Discharge status: Home / Self Care | Payer: Self-pay | Attending: Emergency Medicine | Admitting: Emergency Medicine

## 2012-01-20 ENCOUNTER — Other Ambulatory Visit: Payer: Self-pay

## 2012-01-20 DIAGNOSIS — R55 Syncope and collapse: Secondary | ICD-10-CM | POA: Insufficient documentation

## 2012-01-20 DIAGNOSIS — Z8659 Personal history of other mental and behavioral disorders: Secondary | ICD-10-CM | POA: Insufficient documentation

## 2012-01-20 DIAGNOSIS — Z8619 Personal history of other infectious and parasitic diseases: Secondary | ICD-10-CM | POA: Insufficient documentation

## 2012-01-20 DIAGNOSIS — Z8719 Personal history of other diseases of the digestive system: Secondary | ICD-10-CM | POA: Insufficient documentation

## 2012-01-20 DIAGNOSIS — Z87898 Personal history of other specified conditions: Secondary | ICD-10-CM | POA: Insufficient documentation

## 2012-01-20 DIAGNOSIS — Z87891 Personal history of nicotine dependence: Secondary | ICD-10-CM | POA: Insufficient documentation

## 2012-01-20 LAB — URINE MICROSCOPIC-ADD ON

## 2012-01-20 LAB — URINALYSIS, ROUTINE W REFLEX MICROSCOPIC
Glucose, UA: NEGATIVE mg/dL
Hgb urine dipstick: NEGATIVE
Ketones, ur: 15 mg/dL — AB
pH: 5.5 (ref 5.0–8.0)

## 2012-01-20 LAB — GLUCOSE, CAPILLARY: Glucose-Capillary: 81 mg/dL (ref 70–99)

## 2012-01-20 LAB — D-DIMER, QUANTITATIVE: D-Dimer, Quant: <0.27 ug/mL-FEU (ref 0.00–0.48)

## 2012-01-20 MED ORDER — SODIUM CHLORIDE 0.9 % IV BOLUS (SEPSIS)
1000.0000 mL | Freq: Once | INTRAVENOUS | Status: AC
  Administered 2012-01-20: 1000 mL via INTRAVENOUS

## 2012-01-20 NOTE — ED Notes (Signed)
MD at bedside. 

## 2012-01-20 NOTE — ED Provider Notes (Signed)
History     CSN: [id number]  Arrival date & time 01/20/12  1139   First MD Initiated Contact with Patient 01/20/12 1201      Chief Complaint  Patient presents with  . 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  She was standing while working, felt dizzy/lightheaded, saw spots, was hearing an echo, was clammy and her aunt thought she looked pale.  She then had brief syncopal event, caught by another employee, did not fall or injure herself.  She regained consciousness soon after and returned to baseline quickly. This has happened 5-6 times in the past.  Pt has h/o anxiety, denies current stressors.   Also complaining about Right rib pain, worse with breathing, "like her ribs are punched inside out", 7/10, associated with mild sob with pain.  Also c/o HA 3/10 at the left temple and behind the left eye.  Also c/o an ache in the mid thoracic region 4/10, achy, non radiating pain with out numbness, tingling or weakness.  Past Medical History  Diagnosis Date  . Seizures   . Abnormal Pap smear   . HPV in female   . Chlamydia 2010  . Gonorrhea 2010  . Depression   . Paranoia   . Panic attacks   . Colitis 03-2009  . Substance abuse   . Cystitis     Past Surgical History  Procedure Date  . Colonoscopy 12-2009    Family History  Problem Relation Age of Onset  . Anesthesia problems Neg Hx     History  Substance Use Topics  . Smoking status: Former Smoker -- 0.5 packs/day for 10 years    Types: Cigarettes    Quit date: 07/28/2010  . Smokeless tobacco: Never Used  . Alcohol Use: No     Comment: 3-4 per week    OB History    Grav Para Term Preterm Abortions TAB SAB Ect Mult Living   3 1 1  0 2 0 2 0 0 1      Review of Systems At least 10pt or greater review of  systems completed and are negative except where specified in the HPI.  Allergies  Cephalexin; Cheese flavor; Penicillins; Vicodin; and Latex  Home Medications  No current outpatient prescriptions on file.  BP 102/61  Pulse 98  Temp 98.5 F (36.9 C) (Oral)  Resp 16  SpO2 100%  Breastfeeding? Yes  Physical Exam  Nursing notes reviewed.  Electronic medical record reviewed. VITAL SIGNS:   Filed Vitals:   01/20/12 1400 01/20/12 1500 01/20/12 1655 01/20/12 1700  BP: 102/81 94/37 104/47 101/50  Pulse: 77 61 69 81  Temp:      TempSrc:      Resp: 15 17  18   SpO2: 97% 98%  100%   CONSTITUTIONAL: Awake, oriented x4, appears non-toxic HENT: Atraumatic, normocephalic, oral mucosa pink and moist, airway patent. Nares patent without drainage. External ears normal. EYES: Conjunctiva clear, EOMI, PERRLA NECK: Trachea midline, non-tender, supple CARDIOVASCULAR: Normal heart rate, Normal rhythm, No murmurs, rubs, gallops PULMONARY/CHEST: Clear to auscultation, no rhonchi, wheezes, or rales. Symmetrical breath sounds. Non-tender. ABDOMINAL: Non-distended, soft, non-tender - no rebound or guarding.  BS normal. NEUROLOGIC: Non-focal, moving all four extremities, no gross sensory or motor deficits. EXTREMITIES: No clubbing, cyanosis, or edema SKIN: Warm, Dry, No erythema, No rash  ED Course  Procedures (  including critical care time)  Date: 01/21/2012  Rate: 73  Rhythm: normal sinus rhythm  QRS Axis: normal  Intervals: normal  ST/T Wave abnormalities: normal  Conduction Disutrbances: none  Narrative Interpretation: unremarkable, NSR, no delta wave, short PR, Brugada, LVH     Labs Reviewed  URINALYSIS, ROUTINE W REFLEX MICROSCOPIC - Abnormal; Notable for the following:    Bilirubin Urine SMALL (*)     Ketones, ur 15 (*)     Protein, ur 30 (*)     All other components within normal limits  URINE MICROSCOPIC-ADD ON - Abnormal; Notable for the following:    Squamous Epithelial / LPF  FEW (*)     Bacteria, UA MANY (*)     All other components within normal limits  GLUCOSE, CAPILLARY  D-DIMER, QUANTITATIVE  POCT PREGNANCY, URINE  URINE CULTURE   No results found.   1. Vaso vagal episode       MDM  KLYNN LINNEMANN is a 21 y.o. female presents with syncope - this has happened multiple times before, as with prior episodes, she has no residual complaints.  She has complaints of widespread pain in the back, HA, and ribs.  I doubt any of these are serios with normal vitals, EKG.  Added DDim with pleuritic CP nature and syncopal episode -and was negative.  Very low risk for acute cardiothoracic source of syncope.  Likely vaso-vagal mediated.  Pt drinks caffeine in large amounts and is mildly dehydrated, gave her fluids, advised avoiding caffeine esp with breastfeeding.  I explained the diagnosis and have given explicit precautions to return to the ER including chest pain, shortness of breath, recurrent syncopal episode without return to baseline or any other new or worsening symptoms. The patient understands and accepts the medical plan as it's been dictated and I have answered their questions. Discharge instructions concerning home care and prescriptions have been given.  The patient is STABLE and is discharged to home in good condition.         Jones Skene, MD 01/21/12 1739

## 2012-01-20 NOTE — ED Notes (Signed)
Pt. From work via EMS. States she felt dizzy, weak and started to sweat. She sat in a chair and was led to the ground by co-workers.

## 2012-01-21 LAB — URINE CULTURE

## 2012-02-10 ENCOUNTER — Encounter: Payer: Self-pay | Admitting: Cardiovascular Disease

## 2012-02-12 ENCOUNTER — Ambulatory Visit: Payer: Medicaid Other | Admitting: Cardiovascular Disease

## 2014-01-03 ENCOUNTER — Encounter: Payer: Self-pay | Admitting: Cardiovascular Disease

## 2014-08-15 ENCOUNTER — Encounter (HOSPITAL_COMMUNITY): Payer: Self-pay | Admitting: *Deleted

## 2014-08-15 ENCOUNTER — Emergency Department (HOSPITAL_COMMUNITY)
Admission: EM | Admit: 2014-08-15 | Discharge: 2014-08-16 | Disposition: A | Discharge status: Home / Self Care | Payer: 59 | Attending: Emergency Medicine | Admitting: Emergency Medicine

## 2014-08-15 DIAGNOSIS — R202 Paresthesia of skin: Secondary | ICD-10-CM | POA: Diagnosis not present

## 2014-08-15 DIAGNOSIS — L299 Pruritus, unspecified: Secondary | ICD-10-CM | POA: Diagnosis not present

## 2014-08-15 DIAGNOSIS — Z87448 Personal history of other diseases of urinary system: Secondary | ICD-10-CM | POA: Insufficient documentation

## 2014-08-15 DIAGNOSIS — Z87891 Personal history of nicotine dependence: Secondary | ICD-10-CM | POA: Insufficient documentation

## 2014-08-15 DIAGNOSIS — Z8659 Personal history of other mental and behavioral disorders: Secondary | ICD-10-CM | POA: Insufficient documentation

## 2014-08-15 DIAGNOSIS — Z880 Allergy status to penicillin: Secondary | ICD-10-CM | POA: Diagnosis not present

## 2014-08-15 DIAGNOSIS — Z8619 Personal history of other infectious and parasitic diseases: Secondary | ICD-10-CM | POA: Insufficient documentation

## 2014-08-15 DIAGNOSIS — Z8719 Personal history of other diseases of the digestive system: Secondary | ICD-10-CM | POA: Diagnosis not present

## 2014-08-15 DIAGNOSIS — Z91040 Latex allergy status: Secondary | ICD-10-CM | POA: Diagnosis not present

## 2014-08-15 DIAGNOSIS — Z3202 Encounter for pregnancy test, result negative: Secondary | ICD-10-CM | POA: Insufficient documentation

## 2014-08-15 DIAGNOSIS — R200 Anesthesia of skin: Secondary | ICD-10-CM | POA: Diagnosis present

## 2014-08-15 NOTE — ED Notes (Signed)
Pt reports since yesterday, she had been having generalized numbness and R leg itchiness.  Pt reports she would scratch her leg but does not feel it.  Pt is ambulatory without difficulty.

## 2014-08-16 LAB — URINALYSIS, ROUTINE W REFLEX MICROSCOPIC
Bilirubin Urine: NEGATIVE
Glucose, UA: NEGATIVE mg/dL
HGB URINE DIPSTICK: NEGATIVE
KETONES UR: NEGATIVE mg/dL
Nitrite: NEGATIVE
PH: 7 (ref 5.0–8.0)
PROTEIN: NEGATIVE mg/dL
Specific Gravity, Urine: 1.019 (ref 1.005–1.030)
Urobilinogen, UA: 0.2 mg/dL (ref 0.0–1.0)

## 2014-08-16 LAB — I-STAT CHEM 8, ED
BUN: 15 mg/dL (ref 6–20)
CALCIUM ION: 1.21 mmol/L (ref 1.12–1.23)
CHLORIDE: 103 mmol/L (ref 101–111)
CREATININE: 0.7 mg/dL (ref 0.44–1.00)
GLUCOSE: 98 mg/dL (ref 65–99)
HCT: 38 % (ref 36.0–46.0)
Hemoglobin: 12.9 g/dL (ref 12.0–15.0)
POTASSIUM: 3.7 mmol/L (ref 3.5–5.1)
Sodium: 140 mmol/L (ref 135–145)
TCO2: 22 mmol/L (ref 0–100)

## 2014-08-16 LAB — PREGNANCY, URINE: Preg Test, Ur: NEGATIVE

## 2014-08-16 LAB — URINE MICROSCOPIC-ADD ON

## 2014-08-16 NOTE — ED Provider Notes (Signed)
CSN: [id number]     Arrival date & time 08/15/14  2239 History   First MD Initiated Contact with Patient 08/16/14 0052     Chief Complaint  Patient presents with  . Numbness  . Pruritis     (Consider location/radiation/quality/duration/timing/severity/associated sxs/prior Treatment) HPI Comments: She presents for evaluation of decreased sensation of upper body (torso and arms) and itching. No rash, weakness, swelling or lower extremity symptoms. She reports longstanding history of "pins and needles" in lower extremities and presenting upper body symptoms for 2-3 days. No fever at any time. No medications, vomiting, diarrhea, recent illness.   The history is provided by the patient. No language interpreter was used.    Past Medical History  Diagnosis Date  . Seizures   . Abnormal Pap smear   . HPV in female   . Chlamydia 2010  . Gonorrhea 2010  . Depression   . Paranoia   . Panic attacks   . Colitis 03-2009  . Substance abuse   . Cystitis    Past Surgical History  Procedure Laterality Date  . Colonoscopy  12-2009   Family History  Problem Relation Age of Onset  . Anesthesia problems Neg Hx    History  Substance Use Topics  . Smoking status: Former Smoker -- 0.50 packs/day for 10 years    Types: Cigarettes    Quit date: 07/28/2010  . Smokeless tobacco: Never Used  . Alcohol Use: No     Comment: 3-4 per week   OB History    Gravida Para Term Preterm AB TAB SAB Ectopic Multiple Living   3 1 1  0 2 0 2 0 0 1     Review of Systems  Constitutional: Negative for fever and chills.  HENT: Negative.   Respiratory: Negative.   Cardiovascular: Negative.   Gastrointestinal: Negative.   Musculoskeletal: Negative.   Skin: Negative.   Neurological:       See HPI.      Allergies  Cephalexin; Cheese flavor; Penicillins; Vicodin; and Latex  Home Medications   Prior to Admission medications   Not on File   BP 105/58 mmHg  Pulse 64  Temp(Src) 98.1 F (36.7 C)  (Oral)  Resp 16  SpO2 100%  LMP 06/28/2014 Physical Exam  Constitutional: She is oriented to person, place, and time. She appears well-developed and well-nourished.  HENT:  Head: Normocephalic.  Neck: Normal range of motion. Neck supple.  Cardiovascular: Normal rate and regular rhythm.   Pulmonary/Chest: Effort normal and breath sounds normal.  Abdominal: Soft. Bowel sounds are normal. There is no tenderness. There is no rebound and no guarding.  Musculoskeletal: Normal range of motion. She exhibits no edema or tenderness.  Neurological: She is alert and oriented to person, place, and time.  Equal grip strength of upper extremities. Sensation is decreased in bilateral upper extremities equally.   Skin: Skin is warm and dry. No rash noted. No erythema.  Psychiatric: She has a normal mood and affect.    ED Course  Procedures (including critical care time) Labs Review Labs Reviewed  PREGNANCY, URINE  URINALYSIS, ROUTINE W REFLEX MICROSCOPIC (NOT AT [facility])  I-STAT CHEM 8, ED   Results for orders placed or performed during the hospital encounter of 08/15/14  Pregnancy, urine  Result Value Ref Range   Preg Test, Ur NEGATIVE NEGATIVE  Urinalysis, Routine w reflex microscopic (not at [facility])  Result Value Ref Range   Color, Urine YELLOW YELLOW   APPearance CLOUDY (A) CLEAR  Specific Gravity, Urine 1.019 1.005 - 1.030   pH 7.0 5.0 - 8.0   Glucose, UA NEGATIVE NEGATIVE mg/dL   Hgb urine dipstick NEGATIVE NEGATIVE   Bilirubin Urine NEGATIVE NEGATIVE   Ketones, ur NEGATIVE NEGATIVE mg/dL   Protein, ur NEGATIVE NEGATIVE mg/dL   Urobilinogen, UA 0.2 0.0 - 1.0 mg/dL   Nitrite NEGATIVE NEGATIVE   Leukocytes, UA MODERATE (A) NEGATIVE  Urine microscopic-add on  Result Value Ref Range   Squamous Epithelial / LPF RARE RARE   WBC, UA 7-10 <3 WBC/hpf   Bacteria, UA RARE RARE  I-stat Chem 8, ED  Result Value Ref Range   Sodium 140 135 - 145 mmol/L   Potassium 3.7 3.5 - 5.1 mmol/L    Chloride 103 101 - 111 mmol/L   BUN 15 6 - 20 mg/dL   Creatinine, Ser 1.61 0.44 - 1.00 mg/dL   Glucose, Bld 98 65 - 99 mg/dL   Calcium, Ion 0.96 0.45 - 1.23 mmol/L   TCO2 22 0 - 100 mmol/L   Hemoglobin 12.9 12.0 - 15.0 g/dL   HCT 40.9 81.1 - 91.4 %    Imaging Review No results found.   EKG Interpretation None      MDM   Final diagnoses:  None    1. Paresthesias  Patient is well appearing, VSS, no focal exam abnormalities. Basic labs checked and are normal. She can be discharged home and will refer to neurology for further evaluation.     Elpidio Anis, PA-C 08/16/14 7829  Marisa Severin, MD 08/16/14 [phone]

## 2014-08-16 NOTE — Discharge Instructions (Signed)

## 2014-08-17 ENCOUNTER — Telehealth (HOSPITAL_BASED_OUTPATIENT_CLINIC_OR_DEPARTMENT_OTHER): Payer: Self-pay | Admitting: Emergency Medicine

## 2015-11-29 ENCOUNTER — Emergency Department (HOSPITAL_COMMUNITY)
Admission: EM | Admit: 2015-11-29 | Discharge: 2015-11-29 | Disposition: A | Discharge status: Home / Self Care | Payer: Medicaid Other | Attending: Emergency Medicine | Admitting: Emergency Medicine

## 2015-11-29 ENCOUNTER — Encounter (HOSPITAL_COMMUNITY): Payer: Self-pay | Admitting: Emergency Medicine

## 2015-11-29 DIAGNOSIS — R109 Unspecified abdominal pain: Secondary | ICD-10-CM | POA: Insufficient documentation

## 2015-11-29 DIAGNOSIS — R197 Diarrhea, unspecified: Secondary | ICD-10-CM | POA: Insufficient documentation

## 2015-11-29 DIAGNOSIS — R112 Nausea with vomiting, unspecified: Secondary | ICD-10-CM

## 2015-11-29 DIAGNOSIS — Z87891 Personal history of nicotine dependence: Secondary | ICD-10-CM | POA: Insufficient documentation

## 2015-11-29 LAB — CBC
HCT: 37.1 % (ref 36.0–46.0)
HEMOGLOBIN: 12.5 g/dL (ref 12.0–15.0)
MCH: 28.9 pg (ref 26.0–34.0)
MCHC: 33.7 g/dL (ref 30.0–36.0)
MCV: 85.9 fL (ref 78.0–100.0)
PLATELETS: 134 10*3/uL — AB (ref 150–400)
RBC: 4.32 MIL/uL (ref 3.87–5.11)
RDW: 12.7 % (ref 11.5–15.5)
WBC: 6.3 10*3/uL (ref 4.0–10.5)

## 2015-11-29 LAB — URINALYSIS, ROUTINE W REFLEX MICROSCOPIC
Bilirubin Urine: NEGATIVE
Glucose, UA: NEGATIVE mg/dL
HGB URINE DIPSTICK: NEGATIVE
Ketones, ur: 40 mg/dL — AB
LEUKOCYTES UA: NEGATIVE
NITRITE: NEGATIVE
Protein, ur: 30 mg/dL — AB
SPECIFIC GRAVITY, URINE: 1.029 (ref 1.005–1.030)
pH: 5.5 (ref 5.0–8.0)

## 2015-11-29 LAB — COMPREHENSIVE METABOLIC PANEL
ALK PHOS: 33 U/L — AB (ref 38–126)
ALT: 13 U/L — AB (ref 14–54)
ANION GAP: 8 (ref 5–15)
AST: 20 U/L (ref 15–41)
Albumin: 4.9 g/dL (ref 3.5–5.0)
BUN: 12 mg/dL (ref 6–20)
CALCIUM: 9 mg/dL (ref 8.9–10.3)
CO2: 25 mmol/L (ref 22–32)
CREATININE: 0.58 mg/dL (ref 0.44–1.00)
Chloride: 107 mmol/L (ref 101–111)
Glucose, Bld: 101 mg/dL — ABNORMAL HIGH (ref 65–99)
Potassium: 3.7 mmol/L (ref 3.5–5.1)
SODIUM: 140 mmol/L (ref 135–145)
TOTAL PROTEIN: 7.9 g/dL (ref 6.5–8.1)
Total Bilirubin: 0.8 mg/dL (ref 0.3–1.2)

## 2015-11-29 LAB — URINE MICROSCOPIC-ADD ON: RBC / HPF: NONE SEEN RBC/hpf (ref 0–5)

## 2015-11-29 LAB — I-STAT BETA HCG BLOOD, ED (MC, WL, AP ONLY)

## 2015-11-29 LAB — LIPASE, BLOOD: Lipase: 30 U/L (ref 11–51)

## 2015-11-29 MED ORDER — DICYCLOMINE HCL 20 MG PO TABS: 20.0000 mg | ORAL_TABLET | Freq: Four times a day (QID) | ORAL | 0 refills | Status: DC

## 2015-11-29 MED ORDER — SODIUM CHLORIDE 0.9 % IV BOLUS (SEPSIS)
1000.0000 mL | Freq: Once | INTRAVENOUS | Status: AC
  Administered 2015-11-29: 1000 mL via INTRAVENOUS

## 2015-11-29 MED ORDER — ONDANSETRON 4 MG PO TBDP: 4.0000 mg | ORAL_TABLET | Freq: Three times a day (TID) | ORAL | 0 refills | Status: DC | PRN

## 2015-11-29 NOTE — ED Notes (Signed)
Pt cannot use restroom at this time, aware urine specimen is needed.  

## 2015-11-29 NOTE — ED Provider Notes (Signed)
[facility]-EMERGENCY DEPT Provider Note   CSN: [id number] Arrival date & time: 11/29/15  1130     History   Chief Complaint Chief Complaint  Patient presents with  . Abdominal Pain  . Nausea  . Diarrhea    HPI Joanne Newman is a 25 y.o. female.  Joanne Newman is a 25 y.o. female with h/o substance abuse, marijuana use, ulcerative colitis presents to ED with complaint of N/V/D. For the last three weeks she reports waking up each morning with nausea, diaphoresis, and either vomiting or diarrhea for 1-2 hours. She does endorses intermittent abdominal pain that resolves following BM. She typically has 1-2 episodes of vomiting or diarrhea each day. Following, she is asymptomatic the rest of the day and able to eat and drink. She does state she has had "streaks of blood" in her emesis, no coffee ground emesis. No blood in stool. Pt currently denies sxs. She also reports some associated lightheadedness and is concerned she is getting dehydrated prompting her to come be evaluated. No LOC. She denies fever, chest pain, shortness of breath, dysuria, hematuria, changes in vision, trouble swallowing. LMP 11/22/15, she is sexually active with two female partners in the last 6 months, intermittent barrier protection used. No other abdominal conditions or gynecologic conditions. No h/o abdominal surgeries. No h/o gynecologic surgeries. No PCP. She endorses daily marijuana use for PTSD, denies smoking, alcohol, or other drug use.       Past Medical History:  Diagnosis Date  . Abnormal Pap smear   . Chlamydia 2010  . Colitis 03-2009  . Cystitis   . Depression   . Gonorrhea 2010  . HPV in female   . Panic attacks   . Paranoia (HCC)   . Seizures (HCC)   . Substance abuse     Patient Active Problem List   Diagnosis Date Noted  . History of drug abuse 01/31/2011  . NSVD (normal spontaneous vaginal delivery) 01/29/2011  . Meconium in amniotic fluid 01/29/2011  . Depression 01/29/2011  .  PTSD (post-traumatic stress disorder) 01/29/2011  . Anxiety 01/29/2011  . Marijuana smoker (HCC) 01/29/2011  . Cigarette smoker 01/29/2011  . H/O: suicide attempt 01/29/2011    Past Surgical History:  Procedure Laterality Date  . COLONOSCOPY  12-2009    OB History    Gravida Para Term Preterm AB Living   3 1 1  0 2 1   SAB TAB Ectopic Multiple Live Births   2 0 0 0 1       Home Medications    Prior to Admission medications   Medication Sig Start Date End Date Taking? Authorizing Provider  dicyclomine (BENTYL) 20 MG tablet Take 1 tablet (20 mg total) by mouth 4 (four) times daily. 11/29/15   Lona Kettle, PA-C  ondansetron (ZOFRAN ODT) 4 MG disintegrating tablet Take 1 tablet (4 mg total) by mouth every 8 (eight) hours as needed for nausea or vomiting. 11/29/15   Lona Kettle, PA-C    Family History Family History  Problem Relation Age of Onset  . Anesthesia problems Neg Hx     Social History Social History  Substance Use Topics  . Smoking status: Former Smoker    Packs/day: 0.50    Years: 10.00    Types: Cigarettes    Quit date: 07/28/2010  . Smokeless tobacco: Never Used  . Alcohol use No     Comment: 3-4 per week     Allergies   Cephalexin; Cheese flavor [flavoring agent];  Vicodin [hydrocodone-acetaminophen]; Latex; and Penicillins   Review of Systems Review of Systems  Constitutional: Positive for diaphoresis and unexpected weight change. Negative for chills and fever.  HENT: Negative for trouble swallowing.   Eyes: Negative for visual disturbance.  Respiratory: Negative for shortness of breath.   Cardiovascular: Negative for chest pain.  Gastrointestinal: Positive for abdominal pain ( intermittent), diarrhea ( intermittent), nausea ( intermittent) and vomiting ( intermittent). Negative for blood in stool and constipation.  Genitourinary: Negative for dysuria and hematuria.  Musculoskeletal: Negative for arthralgias, myalgias and neck pain.    Skin: Negative for rash.  Neurological: Negative for syncope.     Physical Exam Updated Vital Signs BP 109/74 (BP Location: Left Arm)   Pulse 75   Temp 98.2 F (36.8 C) (Oral)   Resp 15   Ht 5\' 5"  (1.651 m)   Wt 50.8 kg   LMP 11/22/2015   SpO2 97%   BMI 18.64 kg/m   Physical Exam  Constitutional: She appears well-developed and well-nourished. No distress.  HENT:  Head: Normocephalic and atraumatic.  Mouth/Throat: Uvula is midline and oropharynx is clear and moist. Mucous membranes are dry. No trismus in the jaw. No oropharyngeal exudate.  Eyes: Conjunctivae and EOM are normal. Pupils are equal, round, and reactive to light. Right eye exhibits no discharge. Left eye exhibits no discharge. No scleral icterus.  Neck: Normal range of motion and phonation normal. Neck supple. No neck rigidity. Normal range of motion present.  Cardiovascular: Normal rate, regular rhythm, normal heart sounds and intact distal pulses.   No murmur heard. Pulmonary/Chest: Effort normal and breath sounds normal. No stridor. No respiratory distress. She has no wheezes. She has no rales.  Abdominal: Soft. Bowel sounds are normal. She exhibits no distension. There is no tenderness. There is no rigidity, no rebound, no guarding and no CVA tenderness.  Abdomen is soft and non-tender. No guarding, rigidity, or rebound.   Musculoskeletal: Normal range of motion.  Lymphadenopathy:    She has no cervical adenopathy.  Neurological: She is alert. She is not disoriented. Coordination and gait normal. GCS eye subscore is 4. GCS verbal subscore is 5. GCS motor subscore is 6.  Patient moves all fours.   Skin: Skin is warm and dry. She is not diaphoretic.  Psychiatric: She has a normal mood and affect. Her behavior is normal.     ED Treatments / Results  Labs (all labs ordered are listed, but only abnormal results are displayed) Labs Reviewed  COMPREHENSIVE METABOLIC PANEL - Abnormal; Notable for the following:        Result Value   Glucose, Bld 101 (*)    ALT 13 (*)    Alkaline Phosphatase 33 (*)    All other components within normal limits  CBC - Abnormal; Notable for the following:    Platelets 134 (*)    All other components within normal limits  URINALYSIS, ROUTINE W REFLEX MICROSCOPIC (NOT AT [facility]) - Abnormal; Notable for the following:    APPearance CLOUDY (*)    Ketones, ur 40 (*)    Protein, ur 30 (*)    All other components within normal limits  URINE MICROSCOPIC-ADD ON - Abnormal; Notable for the following:    Squamous Epithelial / LPF 0-5 (*)    Bacteria, UA MANY (*)    All other components within normal limits  LIPASE, BLOOD  I-STAT BETA HCG BLOOD, ED ([facility], [facility], [facility] ONLY)    EKG  EKG Interpretation None  Radiology No results found.  Procedures Procedures (including critical care time)  Medications Ordered in ED Medications  sodium chloride 0.9 % bolus 1,000 mL (1,000 mLs Intravenous New Bag/Given 11/29/15 1332)     Initial Impression / Assessment and Plan / ED Course  I have reviewed the triage vital signs and the nursing notes.  Pertinent labs & imaging results that were available during my care of the patient were reviewed by me and considered in my medical decision making (see chart for details).  Clinical Course    Patient presents to ED with complaint of N/V/D. Patient is afebrile and non-toxic appearing in NAD. VSS. Physical exam is re-assuring. No abdominal distension, tenderness, guarding, or peritoneal signs. Will check basic labs. Check Orthostatic VS. IVF. Pt currently asx. Consult to care management to help establish a PCP.  Orthostatics negative. Hcg negative - doubt pregnancy or ectopic. CMP re-assuring, LFTs and bilirubin are re-assuring - doubt acute cholecystitis or cholangitis. Lipase normal - doubt acute pancreatitis. U/A suggestive of dehydration, no UTI. CBC re-assuring. Abdomen is soft and non-tender, no peritoneal signs or guarding. Pt  tolerating PO fluids. Low suspicion for acute abdomen. Unclear etiology of sxs - ?mild flare of UC. Care management recommends calling [facility] on Monday for follow up.   Discussed results and plan with pt. Symptomatic management discussed. Rx zofran and bentyl. Call [facility] on Monday to schedule a follow up appointment and referral to GI. Strict return precautions given. Pt voiced understanding and is agreeable.    Final Clinical Impressions(s) / ED Diagnoses   Final diagnoses:  Nausea vomiting and diarrhea    New Prescriptions New Prescriptions   DICYCLOMINE (BENTYL) 20 MG TABLET    Take 1 tablet (20 mg total) by mouth 4 (four) times daily.   ONDANSETRON (ZOFRAN ODT) 4 MG DISINTEGRATING TABLET    Take 1 tablet (4 mg total) by mouth every 8 (eight) hours as needed for nausea or vomiting.     Lona Kettle, [place] 11/29/15 1549    Lyndal Pulley, MD 11/29/15 [phone]

## 2015-11-29 NOTE — Discharge Planning (Signed)
EDCM consulted to assist uninsured pt with getting PCP.  EDCM contacted [facility] and was instructed to have pt call on Monday 10/2 as they will accept new pts at that time.

## 2015-11-29 NOTE — ED Triage Notes (Signed)
Pt reports, NVD x 3 weeks only in the morning, per pt. Hx colitis. denies bloody stools. St slight cramping RLQ. denies urinary symptoms.

## 2015-11-29 NOTE — ED Notes (Signed)
Pt able to tolerate PO fluids without vomiting or difficulty.

## 2015-11-29 NOTE — Discharge Instructions (Signed)
Read the information below.  Your labs were re-assuring, your urine did show some signs of dehydration. You were given fluids in the ED. It is very important that you remain hydrated.  I have written an Rx for zofran and bentyl to help with your symptoms.  It is very important for you to establish a primary doctor for referral to a GI doctor. Please call [facility] on Monday to schedule a follow up appointment.  Use the prescribed medication as directed.  Please discuss all new medications with your pharmacist.   You may return to the Emergency Department at any time for worsening condition or any new symptoms that concern you. Return to ED if you develop fever, constant all day symptoms of abdominal pain, vomiting, diarrhea, blood in stool, coffee ground vomit, or inability to keep food/fluids down.  ]

## 2016-07-23 ENCOUNTER — Encounter (HOSPITAL_COMMUNITY): Payer: Self-pay | Admitting: Emergency Medicine

## 2016-07-23 ENCOUNTER — Emergency Department (HOSPITAL_COMMUNITY)
Admission: EM | Admit: 2016-07-23 | Discharge: 2016-07-23 | Disposition: A | Discharge status: Home / Self Care | Payer: Medicaid Other | Attending: Physician Assistant | Admitting: Physician Assistant

## 2016-07-23 DIAGNOSIS — K002 Abnormalities of size and form of teeth: Secondary | ICD-10-CM | POA: Insufficient documentation

## 2016-07-23 DIAGNOSIS — K032 Erosion of teeth: Secondary | ICD-10-CM

## 2016-07-23 DIAGNOSIS — Z91040 Latex allergy status: Secondary | ICD-10-CM | POA: Insufficient documentation

## 2016-07-23 DIAGNOSIS — K004 Disturbances in tooth formation: Secondary | ICD-10-CM

## 2016-07-23 DIAGNOSIS — Z87891 Personal history of nicotine dependence: Secondary | ICD-10-CM | POA: Insufficient documentation

## 2016-07-23 DIAGNOSIS — K0889 Other specified disorders of teeth and supporting structures: Secondary | ICD-10-CM | POA: Insufficient documentation

## 2016-07-23 MED ORDER — IBUPROFEN 200 MG PO TABS
600.0000 mg | ORAL_TABLET | Freq: Once | ORAL | Status: AC
  Administered 2016-07-23: 600 mg via ORAL
  Filled 2016-07-23: qty 3

## 2016-07-23 MED ORDER — ACETAMINOPHEN 325 MG PO TABS
650.0000 mg | ORAL_TABLET | Freq: Once | ORAL | Status: AC
  Administered 2016-07-23: 650 mg via ORAL
  Filled 2016-07-23: qty 2

## 2016-07-23 MED ORDER — IBUPROFEN 600 MG PO TABS: 600.0000 mg | ORAL_TABLET | Freq: Four times a day (QID) | ORAL | 0 refills | Status: DC | PRN

## 2016-07-23 MED ORDER — TRAMADOL HCL 50 MG PO TABS: 50.0000 mg | ORAL_TABLET | Freq: Four times a day (QID) | ORAL | 0 refills | Status: DC | PRN

## 2016-07-23 NOTE — ED Triage Notes (Signed)
Patient with impacted wisdom teeth.  She is going to have them extracted in July but the pain has recently increased.

## 2016-07-23 NOTE — ED Provider Notes (Signed)
[facility]-EMERGENCY DEPT Provider Note   CSN: [id number] Arrival date & time: 07/23/16  1739  By signing my name below, I, Joanne Newman, attest that this documentation has been prepared under the direction and in the presence of Audry Pili, PA-C. Electronically Signed: Diona Newman, ED Scribe. 07/23/16. 6:46 PM.   History   Chief Complaint Chief Complaint  Patient presents with  . Dental Pain    The history is provided by the patient. No language interpreter was used.     HPI Comments:  Joanne Newman is a 26 y.o. female who presents to the Emergency Department complaining of gradually worsening tooth pain that started ~ 4-5 days ago. Pt reports she has 5 wisdom teeth which are impacted. She has an appointment to have the teeth removed the end of June beginning of July. Pain radiates to her right temple, right ear, and right eye. Pt also notes sinus pressure and HA. She wants something for her pain and to make sure they are not infected. She has been taking Advil, using Orajel and doing salt water rinses with mild relief. Pt denies fever and facial swelling.  Past Medical History:  Diagnosis Date  . Abnormal Pap smear   . Chlamydia 2010  . Colitis 03-2009  . Cystitis   . Depression   . Gonorrhea 2010  . HPV in female   . Panic attacks   . Paranoia (HCC)   . Seizures (HCC)   . Substance abuse     Patient Active Problem List   Diagnosis Date Noted  . History of drug abuse 01/31/2011  . NSVD (normal spontaneous vaginal delivery) 01/29/2011  . Meconium in amniotic fluid 01/29/2011  . Depression 01/29/2011  . PTSD (post-traumatic stress disorder) 01/29/2011  . Anxiety 01/29/2011  . Marijuana smoker 01/29/2011  . Cigarette smoker 01/29/2011  . H/O: suicide attempt 01/29/2011    Past Surgical History:  Procedure Laterality Date  . COLONOSCOPY  12-2009    OB History    Gravida Para Term Preterm AB Living   3 1 1  0 2 1   SAB TAB Ectopic Multiple Live Births   2  0 0 0 1       Home Medications    Prior to Admission medications   Medication Sig Start Date End Date Taking? Authorizing Provider  dicyclomine (BENTYL) 20 MG tablet Take 1 tablet (20 mg total) by mouth 4 (four) times daily. 11/29/15   Deborha Payment, PA-C  ondansetron (ZOFRAN ODT) 4 MG disintegrating tablet Take 1 tablet (4 mg total) by mouth every 8 (eight) hours as needed for nausea or vomiting. 11/29/15   Deborha Payment, PA-C    Family History Family History  Problem Relation Age of Onset  . Anesthesia problems Neg Hx     Social History Social History  Substance Use Topics  . Smoking status: Former Smoker    Packs/day: 0.50    Years: 10.00    Types: Cigarettes    Quit date: 07/28/2010  . Smokeless tobacco: Never Used  . Alcohol use No     Comment: 3-4 per week     Allergies   Cephalexin; Cheese flavor [flavoring agent]; Vicodin [hydrocodone-acetaminophen]; Latex; and Penicillins   Review of Systems Review of Systems  Constitutional: Negative for fever.  HENT: Positive for dental problem, ear pain and sinus pressure. Negative for facial swelling.   Eyes: Positive for pain.  Neurological: Positive for headaches.     Physical Exam Updated Vital Signs BP 128/60 (  BP Location: Right Arm)   Pulse (!) 59   Temp 98.9 F (37.2 C) (Oral)   Resp 12   Ht 5\' 5"  (1.651 m)   Wt 120 lb (54.4 kg)   LMP 06/18/2016 (Approximate)   SpO2 100%   BMI 19.97 kg/m   Physical Exam  Constitutional: She is oriented to person, place, and time. She appears well-developed and well-nourished. No distress.  HENT:  Head: Normocephalic and atraumatic.  Posterior right molar with chipped enamel.  No obvious swelling or signs of infection.  No purulence noted.  FROM of neck without pain.  Eyes: Conjunctivae are normal.  Cardiovascular: Normal rate.   Pulmonary/Chest: Effort normal.  Abdominal: She exhibits no distension.  Neurological: She is alert and oriented to person, place,  and time.  Skin: Skin is warm and dry.  Nursing note and vitals reviewed.  ED Treatments / Results  DIAGNOSTIC STUDIES:  Oxygen Saturation is 100% on RA, normal by my interpretation.    COORDINATION OF CARE:  6:45 PM Discussed treatment plan with pt at bedside and pt agreed to plan.  Labs (all labs ordered are listed, but only abnormal results are displayed) Labs Reviewed - No data to display  EKG  EKG Interpretation None       Radiology No results found.  Procedures Procedures (including critical care time)  Medications Ordered in ED Medications - No data to display   Initial Impression / Assessment and Plan / ED Course  I have reviewed the triage vital signs and the nursing notes.  Pertinent labs & imaging results that were available during my care of the patient were reviewed by me and considered in my medical decision making (see chart for details).  Final Clinical Impressions(s) / ED Diagnoses     {I have reviewed the relevant previous healthcare records.  {I obtained HPI from historian.   ED Course:  Assessment: Dental pain associated with dental cary but no signs or symptoms of dental abscess with patient afebrile, non toxic appearing and swallowing secretions well. Exam unconcerning for Ludwig's angina or other deep tissue infection in neck. As there is no facial swelling or gum findings, will not prescribe antibiotics at this time. Will treat with pain medication.  I gave patient referral to dentist and stressed the importance of dental follow up for ultimate management of dental pain. Patient voices understanding and is agreeable to plan.   Disposition/Plan:  DC Home Additional Verbal discharge instructions given and discussed with patient.  Pt Instructed to f/u with Dentist in the next week for evaluation and treatment of symptoms. Return precautions given Pt acknowledges and agrees with plan  Supervising Physician Mackuen, Courteney Lyn, *  Final  diagnoses:  Pain, dental  Enamel defect of tooth    New Prescriptions New Prescriptions   No medications on file   I personally performed the services described in this documentation, which was scribed in my presence. The recorded information has been reviewed and is accurate.    Audry PiliMohr, Gigi Onstad, PA-C 07/23/16 1853    Abelino DerrickMackuen, Courteney Lyn, MD 07/23/16 2252
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
## 2016-10-09 ENCOUNTER — Emergency Department (HOSPITAL_COMMUNITY): Payer: Medicaid Other

## 2016-10-09 ENCOUNTER — Emergency Department (HOSPITAL_COMMUNITY)
Admission: EM | Admit: 2016-10-09 | Discharge: 2016-10-09 | Disposition: A | Discharge status: Home / Self Care | Payer: Medicaid Other | Attending: Emergency Medicine | Admitting: Emergency Medicine

## 2016-10-09 ENCOUNTER — Emergency Department (HOSPITAL_COMMUNITY)
Admission: EM | Admit: 2016-10-09 | Discharge: 2016-10-09 | Disposition: A | Discharge status: Home / Self Care | Payer: Self-pay

## 2016-10-09 ENCOUNTER — Encounter (HOSPITAL_COMMUNITY): Payer: Self-pay | Admitting: *Deleted

## 2016-10-09 DIAGNOSIS — M545 Low back pain: Secondary | ICD-10-CM | POA: Insufficient documentation

## 2016-10-09 DIAGNOSIS — Z5321 Procedure and treatment not carried out due to patient leaving prior to being seen by health care provider: Secondary | ICD-10-CM | POA: Insufficient documentation

## 2016-10-09 HISTORY — DX: Ulcerative colitis, unspecified, without complications: K51.90

## 2016-10-09 LAB — POC URINE PREG, ED: Preg Test, Ur: NEGATIVE

## 2016-10-09 NOTE — ED Notes (Signed)
Pt.had to leave.to find a place to stay
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
## 2017-12-30 DIAGNOSIS — Y9389 Activity, other specified: Secondary | ICD-10-CM | POA: Insufficient documentation

## 2017-12-30 DIAGNOSIS — Z79899 Other long term (current) drug therapy: Secondary | ICD-10-CM | POA: Insufficient documentation

## 2017-12-30 DIAGNOSIS — S270XXA Traumatic pneumothorax, initial encounter: Secondary | ICD-10-CM | POA: Insufficient documentation

## 2017-12-30 DIAGNOSIS — Z5329 Procedure and treatment not carried out because of patient's decision for other reasons: Secondary | ICD-10-CM | POA: Insufficient documentation

## 2017-12-30 DIAGNOSIS — W01190A Fall on same level from slipping, tripping and stumbling with subsequent striking against furniture, initial encounter: Secondary | ICD-10-CM | POA: Insufficient documentation

## 2017-12-30 DIAGNOSIS — Z91040 Latex allergy status: Secondary | ICD-10-CM | POA: Insufficient documentation

## 2017-12-30 DIAGNOSIS — Y9289 Other specified places as the place of occurrence of the external cause: Secondary | ICD-10-CM | POA: Insufficient documentation

## 2017-12-30 DIAGNOSIS — Y999 Unspecified external cause status: Secondary | ICD-10-CM | POA: Insufficient documentation

## 2017-12-30 DIAGNOSIS — S20212A Contusion of left front wall of thorax, initial encounter: Secondary | ICD-10-CM | POA: Insufficient documentation

## 2017-12-30 DIAGNOSIS — Z87891 Personal history of nicotine dependence: Secondary | ICD-10-CM | POA: Insufficient documentation

## 2017-12-31 ENCOUNTER — Emergency Department (HOSPITAL_COMMUNITY)
Admission: EM | Admit: 2017-12-31 | Discharge: 2017-12-31 | Disposition: A | Discharge status: Home / Self Care | Payer: Self-pay | Attending: Emergency Medicine | Admitting: Emergency Medicine

## 2017-12-31 ENCOUNTER — Other Ambulatory Visit: Payer: Self-pay | Admitting: Surgery

## 2017-12-31 ENCOUNTER — Other Ambulatory Visit: Payer: Self-pay

## 2017-12-31 ENCOUNTER — Emergency Department (HOSPITAL_COMMUNITY): Payer: Self-pay

## 2017-12-31 ENCOUNTER — Encounter: Payer: Self-pay | Admitting: Emergency Medicine

## 2017-12-31 DIAGNOSIS — J939 Pneumothorax, unspecified: Secondary | ICD-10-CM

## 2017-12-31 DIAGNOSIS — S270XXA Traumatic pneumothorax, initial encounter: Secondary | ICD-10-CM

## 2017-12-31 MED ORDER — OXYCODONE-ACETAMINOPHEN 5-325 MG PO TABS: 1.0000 | ORAL_TABLET | Freq: Three times a day (TID) | ORAL | 0 refills | Status: AC | PRN

## 2017-12-31 MED ORDER — IBUPROFEN 600 MG PO TABS: 600.0000 mg | ORAL_TABLET | Freq: Four times a day (QID) | ORAL | 0 refills | Status: AC | PRN

## 2017-12-31 MED ORDER — IBUPROFEN 200 MG PO TABS
600.0000 mg | ORAL_TABLET | Freq: Once | ORAL | Status: AC
  Administered 2017-12-31: 600 mg via ORAL
  Filled 2017-12-31: qty 3

## 2017-12-31 MED ORDER — LIDOCAINE HCL (PF) 1 % IJ SOLN
30.0000 mL | Freq: Once | INTRAMUSCULAR | Status: DC
  Filled 2017-12-31: qty 30

## 2017-12-31 NOTE — ED Notes (Signed)
RX X 2 GIVEN 

## 2017-12-31 NOTE — ED Provider Notes (Signed)
6:59 AM Assumed care from Dr. Denton Lank, please see their note for full history, physical and decision making until this point. In brief this is a 27 y.o. year old female who presented to the ED tonight with Chest Pain     Rib fracture. Planned chest tube. Patient refused. Pain control, plan for repeat CXR and dispo.   Repeat x-ray shows worsening pneumothorax.  Patient is still hemodynamically stable.  She still competent to make decisions.  I discussed with her for approximately 16 minutes of the need for chest tube.  I discussed all of the complications that are possible with it but also the benefits of having it.  I told her no less than 4 times that she could die with this condition or not have full reexpansion of her lungs which could lead to long-term pulmonary problems and possibly need for different procedures that are more invasive or surgery.  She did not want to stay in the hospital nor did she want to have any procedures done to her.  I tried to the best of my abilities to provide the appropriate medical care to this patient and she still refused she will be leaving AGAINST MEDICAL ADVICE.  I will still speak to cardiothoracic surgery and see what the most appropriate back-up plan is and if they can follow her up in the office.  I discussed with CT surgery office and they will follow her up tomorrow. I once again tried to get her to consent to the appropriate treatment plan but still refuses but states she will come back if symptoms worsening, otherwise follow up with Dr. Laneta Simmers tomorrow.   D/C AMA.  Discharge instructions, including strict return precautions for new or worsening symptoms, given. Patient and/or family verbalized understanding and agreement with the plan as described.   Labs, studies and imaging reviewed by myself and considered in medical decision making if ordered. Imaging interpreted by radiology.  Labs Reviewed - No data to display  DG Ribs Unilateral W/Chest Left   Final Result    DG Chest 2 View    (Results Pending)    No follow-ups on file.    Marily Memos, MD 12/31/17 [phone]

## 2017-12-31 NOTE — ED Notes (Addendum)
MD notified on both blood pressures. MD at bedside speaking with pt

## 2017-12-31 NOTE — ED Notes (Addendum)
MD at bedside talking to pt about xray results and the importance of having a chest tube placed to reduce the pneumothorax. Pt refusing chest tube stating "I dont want any tubes in me." MD gave verbal order to do non-rebreather to attempt to fix pneumo. This RN spoke with pt and friend about the importance of the chest tube and answered all questions regarding chest tube. Pt stated she "will think about it"

## 2017-12-31 NOTE — ED Triage Notes (Signed)
Pt tripped and fell over a patio chair and the side hit under her left breast, she states the area is swollen and very sore and it hurts to breathe

## 2017-12-31 NOTE — ED Notes (Signed)
PT IS DISCHARGED AMA

## 2017-12-31 NOTE — ED Notes (Signed)
Pt back from radiology 

## 2017-12-31 NOTE — ED Provider Notes (Signed)
[facility]-EMERGENCY DEPT Provider Note   CSN: [id number] Arrival date & time: 12/30/17  2334     History   Chief Complaint Chief Complaint  Patient presents with  . Chest Pain    HPI Joanne Newman is a 27 y.o. female.  Patient presents c/o contusion/injury to left lower chest wall earlier this afternoon. Was at storage unit, moving boxes, and fell against chair to left chest. Pain post chest contusion. Constant, dull, moderate, worse w movement or coughing. Denies sob. No preceding cp or discomfort. No fevers. No abd pain. Denies other pain or injury. No meds for pain today or since injury. Skin intact.   The history is provided by the patient.  Chest Pain   Pertinent negatives include no abdominal pain, no back pain, no cough, no fever, no headaches, no shortness of breath and no vomiting.    Past Medical History:  Diagnosis Date  . Abnormal Pap smear   . Chlamydia 2010  . Colitis 03-2009  . Cystitis   . Depression   . Gonorrhea 2010  . HPV in female   . Panic attacks   . Paranoia (HCC)   . Seizures (HCC)   . Substance abuse (HCC)   . Ulcerative colitis [facility])     Patient Active Problem List   Diagnosis Date Noted  . History of drug abuse (HCC) 01/31/2011  . NSVD (normal spontaneous vaginal delivery) 01/29/2011  . Meconium in amniotic fluid 01/29/2011  . Depression 01/29/2011  . PTSD (post-traumatic stress disorder) 01/29/2011  . Anxiety 01/29/2011  . Marijuana smoker 01/29/2011  . Cigarette smoker 01/29/2011  . H/O: suicide attempt 01/29/2011    Past Surgical History:  Procedure Laterality Date  . COLONOSCOPY  12-2009     OB History    Gravida  3   Para  1   Term  1   Preterm  0   AB  2   Living  1     SAB  2   TAB  0   Ectopic  0   Multiple  0   Live Births  1            Home Medications    Prior to Admission medications   Medication Sig Start Date End Date Taking? Authorizing Provider    dicyclomine (BENTYL) 20 MG tablet Take 1 tablet (20 mg total) by mouth 4 (four) times daily. 11/29/15   Deborha Payment, PA-C  ibuprofen (ADVIL,MOTRIN) 600 MG tablet Take 1 tablet (600 mg total) by mouth every 6 (six) hours as needed. 07/23/16   Audry Pili, PA-C  ondansetron (ZOFRAN ODT) 4 MG disintegrating tablet Take 1 tablet (4 mg total) by mouth every 8 (eight) hours as needed for nausea or vomiting. 11/29/15   Deborha Payment, PA-C  traMADol (ULTRAM) 50 MG tablet Take 1 tablet (50 mg total) by mouth every 6 (six) hours as needed. 07/23/16   Audry Pili, PA-C    Family History Family History  Problem Relation Age of Onset  . Anesthesia problems Neg Hx     Social History Social History   Tobacco Use  . Smoking status: Former Smoker    Packs/day: 0.50    Years: 10.00    Pack years: 5.00    Types: Cigarettes    Last attempt to quit: 07/28/2010    Years since quitting: 7.4  . Smokeless tobacco: Never Used  Substance Use Topics  . Alcohol use: No    Comment: 3-4  per week  . Drug use: No    Types: Codeine, Other-see comments, Marijuana, MDMA (Ecstacy)    Comment: vicodine, xanax     Allergies   Cephalexin; Cheese flavor [flavoring agent]; Tramadol; Vicodin [hydrocodone-acetaminophen]; Latex; and Penicillins   Review of Systems Review of Systems  Constitutional: Negative for fever.  HENT: Negative for sore throat.   Eyes: Negative for redness.  Respiratory: Negative for cough and shortness of breath.   Cardiovascular: Positive for chest pain.  Gastrointestinal: Negative for abdominal pain and vomiting.  Genitourinary: Negative for flank pain.  Musculoskeletal: Negative for back pain and neck pain.  Skin: Negative for wound.  Neurological: Negative for headaches.  Hematological: Does not bruise/bleed easily.  Psychiatric/Behavioral: Negative for confusion.     Physical Exam Updated Vital Signs BP 120/67 (BP Location: Right Arm)   Pulse (!) 109   Temp 98.7 F (37.1  C) (Oral)   Resp 16   Ht 1.651 m (5\' 5" )   Wt 54.4 kg   LMP 12/24/2017   SpO2 100%   BMI 19.97 kg/m   Physical Exam  Constitutional: She appears well-developed and well-nourished.  HENT:  Mouth/Throat: Oropharynx is clear and moist.  Eyes: Conjunctivae are normal. No scleral icterus.  Neck: Neck supple. No tracheal deviation present.  Cardiovascular: Normal rate, regular rhythm, normal heart sounds and intact distal pulses.  Pulmonary/Chest: Effort normal and breath sounds normal. No respiratory distress. She exhibits tenderness.  Left chest wall tenderness reproducing symptoms. No crepitus, normal chest wall movement.   Abdominal: Soft. Normal appearance. She exhibits no distension. There is no tenderness.  Genitourinary:  Genitourinary Comments: No cva tenderness.   Musculoskeletal: She exhibits no edema.  Neurological: She is alert.  Skin: Skin is warm and dry. No rash noted.  Psychiatric: She has a normal mood and affect.  Nursing note and vitals reviewed.    ED Treatments / Results  Labs (all labs ordered are listed, but only abnormal results are displayed) Labs Reviewed - No data to display  EKG None  Radiology Dg Ribs Unilateral W/chest Left  Result Date: 12/31/2017 CLINICAL DATA:  27 year old female with fall and left chest wall pain. EXAM: LEFT RIBS AND CHEST - 3+ VIEW COMPARISON:  None. FINDINGS: There is a minimally displaced fracture of the lateral aspect of the left fifth rib. There is a left sided pneumothorax measuring approximately 3.2 cm from the left apical pleural surface. The lungs are clear. There is no pleural effusion. The cardiac silhouette is within normal limits. IMPRESSION: Minimally displaced fracture of the lateral aspect of the left fifth rib with a small left apical pneumothorax. These results were called by telephone at the time of interpretation on 12/31/2017 at 1:47 am to Dr. Cathren Laine , who verbally acknowledged these results.  Electronically Signed   By: Elgie Collard M.D.   On: 12/31/2017 02:00    Procedures Procedures (including critical care time)  Medications Ordered in ED Medications  ibuprofen (ADVIL,MOTRIN) tablet 600 mg (has no administration in time range)     Initial Impression / Assessment and Plan / ED Course  I have reviewed the triage vital signs and the nursing notes.  Pertinent labs & imaging results that were available during my care of the patient were reviewed by me and considered in my medical decision making (see chart for details).  Xrays.  Motrin po.  Reviewed nursing notes and prior charts for additional history.   0215 discussed ptx with patient and need for chest tube/pig  tail catheter.  Pt refuses tube placement. I again discussed reasons for chest tube, the procedure, offered pain medication and numbing medication. I also discussed risks of not placing tube including worsening ptx, increased sob, and additional complication including tension pneumothorax, extreme resp difficulty, cardio/resp arrest. Pt continues to refuse tube placement. She states is willing to stay for o2 therapy, repeat cxr, and will think about re-considering her decision.  I also/again discussed w her that given rib fx/traumatic ptx, I did not feel o2 therapy without tube would improve/resolve issue.  Pt refusing tube, and is placed on o2/mask, continuous pulse ox and monitor.   4098, pt still not agreeable/consenting to tube placement. bp 105/70, c/w prior ED visit(s) in epic, hr 62, rr 16, pulse ox 100%. Trachea midline, no increased wob. Initial xray done at/around 0100, will plan repeat cxr approx 6 hrs post initial.   Signed out to oncoming AM EDP that repeat imaging pending - pt indicating if ptx not improved/increased, will re-consider having tube placed.      Final Clinical Impressions(s) / ED Diagnoses   Final diagnoses:  None    ED Discharge Orders    None       Cathren Laine,  MD 12/31/17 [phone]

## 2017-12-31 NOTE — ED Notes (Signed)
ED Provider at bedside. 

## 2018-01-01 ENCOUNTER — Ambulatory Visit
Admission: RE | Admit: 2018-01-01 | Discharge: 2018-01-01 | Disposition: A | Discharge status: Home / Self Care | Payer: Self-pay | Source: Ambulatory Visit | Attending: Surgery | Admitting: Surgery

## 2018-01-01 ENCOUNTER — Institutional Professional Consult (permissible substitution) (INDEPENDENT_AMBULATORY_CARE_PROVIDER_SITE_OTHER): Payer: Self-pay | Admitting: Physician Assistant

## 2018-01-01 VITALS — BP 101/65 | HR 72 | Resp 18 | Ht 65.0 in | Wt 120.0 lb

## 2018-01-01 DIAGNOSIS — S270XXD Traumatic pneumothorax, subsequent encounter: Secondary | ICD-10-CM

## 2018-01-01 DIAGNOSIS — J939 Pneumothorax, unspecified: Secondary | ICD-10-CM

## 2018-01-01 NOTE — Patient Instructions (Signed)
Please come to the clinic on Tuesday, November 5 for a repeat chest x-ray and evaluation of left traumatic pneumothorax.  Please avoid heavy lifting, no driving, no flying in airplane, no scuba diving, and no smoking.

## 2018-01-01 NOTE — Progress Notes (Signed)
[address]       [place] [address]             [phone]      Joanne Newman is a 27 y.o. female patient.  1. Traumatic pneumothorax, subsequent encounter    Past Medical History:  Diagnosis Date  . Abnormal Pap smear   . Chlamydia 2010  . Colitis 03-2009  . Cystitis   . Depression   . Gonorrhea 2010  . HPV in female   . Panic attacks   . Paranoia (HCC)   . Seizures (HCC)   . Substance abuse (HCC)   . Ulcerative colitis (HCC)    No past surgical history pertinent negatives on file.   Scheduled Meds: Current Outpatient Medications on File Prior to Visit  Medication Sig Dispense Refill  . ibuprofen (ADVIL,MOTRIN) 600 MG tablet Take 1 tablet (600 mg total) by mouth every 6 (six) hours as needed. 30 tablet 0  . oxyCODONE-acetaminophen (PERCOCET) 5-325 MG tablet Take 1 tablet by mouth every 8 (eight) hours as needed for severe pain. 10 tablet 0   No current facility-administered medications on file prior to visit.     Allergies  Allergen Reactions  . Cephalexin Hives  . Cheese Flavor [Flavoring Agent] Itching    Of ears and throat  . Tramadol   . Vicodin [Hydrocodone-Acetaminophen] Nausea And Vomiting  . Latex Rash  . Penicillins Hives and Rash    Seizure Has patient had a PCN reaction causing immediate rash, facial/tongue/throat swelling, SOB or lightheadedness with hypotension: unknown Has patient had a PCN reaction causing severe rash involving mucus membranes or skin necrosis: unknown Has patient had a PCN reaction that required hospitalization : no Has patient had a PCN reaction occurring within the last 10 years: yes If all of the above answers are "NO", then may proceed with Cephalosporin use.     Resp. rate 18, height 5\' 5"  (1.651 m), weight 120 lb (54.4 kg), last menstrual period 12/24/2017, currently breastfeeding.  Subjective   The patient is a 27 year old female who presented to the emergency department yesterday  status post falling off of a chair and fracturing her fifth left rib.  She was seen in the emergency department yesterday but denied any chest tube or further therapy at that time and left AMA.  She presents to the office today for a repeat chest x-ray to evaluate her left pneumothorax.  Objective  Cor: NSR, no murmur Pulm: diminished in the lower lobes, CTA bilaterally Abd: no tenderness Ext: no swelling Wound: n/a  CXR 01/01/2018:  Results:   Previously seen left fifth rib fracture is not well appreciated on this exam.  Known left pneumothorax is again identified and stable. Small left pleural effusion is now seen.   Assessment & Plan   The chest x-ray in the emergency department showed a left pneumothorax of about 10%.  The emergency room physician wanted to place a chest tube but the patient did not want any therapy done at that time.  She does still have some shortness of breath and she does have some associated pain around the area of the broken rib.  She says it is very uncomfortable when she is laying down in bed and it is hard to get comfortable.  On repeat chest x-ray today the left pneumothorax appears stable but still present.  She says her symptoms are about the same.  She knows not to drive or lift any more  heavy objects.  She is aware not to fly in a plane or scuba dive.  She does smoke and I suggested that she stop smoking however, she states that smoking helps with her ulcerative colitis and that since she started smoking again she has not had a flare.  I educated her on the dangers of smoking with a pneumothorax and encourage cessation.  I shared with her that there is about a 20% chance over the next year of repeat pneumothorax and she may need a chest tube in the future.  I discussed the patient with Dr. Laneta Simmers and he would like to bring her back to the clinic on Tuesday, November 5 for a repeat chest x-ray.  We will evaluate at that time if further therapy is indicated.   I informed the patient that if she becomes more short of breath or has more pain on her left side that she may call our office and we can get a chest x-ray to evaluate the pneumothorax.  She can also go to the emergency department if her symptoms occur after hours or over the weekend.  All questions were answered to the patient's satisfaction.  No new medications   Sharlene Dory 01/01/2018

## 2018-01-05 ENCOUNTER — Other Ambulatory Visit: Payer: Self-pay | Admitting: Surgery

## 2018-01-05 DIAGNOSIS — S270XXD Traumatic pneumothorax, subsequent encounter: Secondary | ICD-10-CM

## 2018-01-06 ENCOUNTER — Ambulatory Visit
Admission: RE | Admit: 2018-01-06 | Discharge: 2018-01-06 | Disposition: A | Discharge status: Home / Self Care | Payer: No Typology Code available for payment source | Source: Ambulatory Visit | Attending: Surgery | Admitting: Surgery

## 2018-01-06 ENCOUNTER — Ambulatory Visit (INDEPENDENT_AMBULATORY_CARE_PROVIDER_SITE_OTHER): Payer: Self-pay | Admitting: Physician Assistant

## 2018-01-06 VITALS — BP 98/56 | HR 75 | Resp 20 | Ht 65.0 in | Wt 115.0 lb

## 2018-01-06 DIAGNOSIS — S270XXD Traumatic pneumothorax, subsequent encounter: Secondary | ICD-10-CM

## 2018-01-06 NOTE — Patient Instructions (Signed)
Call our office if symptoms return.

## 2018-01-06 NOTE — Progress Notes (Signed)
      [address]       [place] [address]             [phone]      Joanne Newman is a 27 y.o. female patient who returns to the office for a chest xray to evaluate her left-sided traumatic pneumothorax. On chest xray today she had complete resolution of her pneumothorax.    1. Traumatic pneumothorax, subsequent encounter    Past Medical History:  Diagnosis Date  . Abnormal Pap smear   . Chlamydia 2010  . Colitis 03-2009  . Cystitis   . Depression   . Gonorrhea 2010  . HPV in female   . Panic attacks   . Paranoia (HCC)   . Seizures (HCC)   . Substance abuse (HCC)   . Ulcerative colitis (HCC)    No past surgical history pertinent negatives on file. Scheduled Meds: Current Outpatient Medications on File Prior to Visit  Medication Sig Dispense Refill  . ibuprofen (ADVIL,MOTRIN) 600 MG tablet Take 1 tablet (600 mg total) by mouth every 6 (six) hours as needed. 30 tablet 0  . oxyCODONE-acetaminophen (PERCOCET) 5-325 MG tablet Take 1 tablet by mouth every 8 (eight) hours as needed for severe pain. 10 tablet 0   No current facility-administered medications on file prior to visit.     Allergies  Allergen Reactions  . Cephalexin Hives  . Cheese Flavor [Flavoring Agent] Itching    Of ears and throat  . Tramadol   . Vicodin [Hydrocodone-Acetaminophen] Nausea And Vomiting  . Latex Rash  . Penicillins Hives and Rash    Seizure Has patient had a PCN reaction causing immediate rash, facial/tongue/throat swelling, SOB or lightheadedness with hypotension: unknown Has patient had a PCN reaction causing severe rash involving mucus membranes or skin necrosis: unknown Has patient had a PCN reaction that required hospitalization : no Has patient had a PCN reaction occurring within the last 10 years: yes If all of the above answers are "NO", then may proceed with Cephalosporin use.     Height 5\' 5"  (1.651 m), last menstrual period 12/24/2017, currently  breastfeeding.  Subjective  She is feeling better today. Her shortness of breath has improved. She does still have some movement of the bones where her rib fracture if but the pain is better.   Objective  Cor: RRR, no murmur Pulm: CTA bilaterally and in fields Abd: no tenderness Ext: no edema Wound: NA  Assessment & Plan  Follow-up xray shows complete resolution of her pneumothorax. She does not need any scheduled follow-up with our office. Continue to avoid flying, scuba diving, and heavy lifting.   No medication changes.   Sharlene Dory 01/06/2018

## 2021-05-05 ENCOUNTER — Other Ambulatory Visit: Payer: Self-pay

## 2021-05-05 ENCOUNTER — Encounter (HOSPITAL_COMMUNITY): Payer: Self-pay | Admitting: Emergency Medicine

## 2021-05-05 ENCOUNTER — Emergency Department (HOSPITAL_COMMUNITY)
Admission: EM | Admit: 2021-05-05 | Discharge: 2021-05-06 | Disposition: A | Discharge status: Home / Self Care | Payer: Self-pay | Attending: Student | Admitting: Student

## 2021-05-05 ENCOUNTER — Emergency Department (HOSPITAL_COMMUNITY): Payer: Self-pay

## 2021-05-05 DIAGNOSIS — S6392XA Sprain of unspecified part of left wrist and hand, initial encounter: Secondary | ICD-10-CM | POA: Insufficient documentation

## 2021-05-05 DIAGNOSIS — Y9351 Activity, roller skating (inline) and skateboarding: Secondary | ICD-10-CM | POA: Insufficient documentation

## 2021-05-05 NOTE — ED Provider Triage Note (Signed)
Emergency Medicine Provider Triage Evaluation Note ? ?Joanne Newman , a 31 y.o. female  was evaluated in triage.  Pt complains of left hand pain.  States that she was at a skating rink and her feet came out from under her and she tried to catch herself with her left hand.  Reports pain along the ulnar aspect of the left hand.  No head trauma or LOC. ? ?Physical Exam  ?There were no vitals taken for this visit. ?Gen:   Awake, no distress   ?Resp:  Normal effort  ?MSK:   Moves extremities without difficulty  ?Other:   ? ?Medical Decision Making  ?Medically screening exam initiated at 11:19 PM.  Appropriate orders placed.  TAIGE ANDREWS was informed that the remainder of the evaluation will be completed by another provider, this initial triage assessment does not replace that evaluation, and the importance of remaining in the ED until their evaluation is complete. ?  ?Rayna Sexton, PA-C ?05/05/21 2320 ? ?

## 2021-05-05 NOTE — ED Triage Notes (Signed)
Pt fell tonight at the skating rink, causing pain to her left hand. ?

## 2021-05-06 NOTE — ED Provider Notes (Signed)
?  [place] DEPT ?[facility] Emergency Department ?Provider Note ?MRN:  [id number]  ?Arrival date & time: 05/06/21    ? ?Chief Complaint   ?Hand Pain ?  ?History of Present Illness   ?Joanne Newman is a 31 y.o. year-old female presents to the ED with chief complaint of left hand pain.  She sustained [place] while roller-blading tonight.  She complains of worsening pain with movement and palpation.  She reports some pins and needles sensation into her fingers. ? ? ? ? ?Review of Systems  ?Pertinent review of systems noted in HPI.  ? ? ?Physical Exam  ? ?Vitals:  ? 05/05/21 2319  ?BP: 116/74  ?Pulse: 95  ?Resp: 18  ?Temp: 99.1 ?F (37.3 ?C)  ?SpO2: 99%  ?  ?CONSTITUTIONAL:  well-appearing, NAD ?NEURO:  Alert and oriented x 3, CN 3-12 grossly intact ?EYES:  eyes equal and reactive ?ENT/NECK:  Supple, no stridor  ?CARDIO:  normal rate, regular rhythm, appears well-perfused  ?PULM:  No respiratory distress,  ?GI/GU:  non-distended,  ?MSK/SPINE:  No gross deformities, no edema, mild contusion to left hypothenar eminence, ROM decreased 2/2 pain ?SKIN:  no rash, atraumatic ? ? ?*Additional and/or pertinent findings included in MDM below ? ?Diagnostic and Interventional Summary  ? ? ?Labs Reviewed - No data to display  ?DG Hand Complete Left  ?Final Result  ?  ?  ?Medications - No data to display  ? ?Procedures  /  Critical Care ?Procedures ? ?ED Course and Medical Decision Making  ?I have reviewed the triage vital signs, the nursing notes, and pertinent available records from the EMR. ? ?Complexity of Problems Addressed: ?Moderate Complexity: Acute complicated illness or injury, requiring diagnostic workup as ordered and performed below. ?Comorbidities affecting this illness/injury include: ?None ?Social Determinants Affecting Care: ? ? ? ?ED Course: ?After considering the following differential, hand sprain, fx, I ordered wrist splint and agree with triage workup. ?I visualized the hand x-ray which is notable  for no fx or dislocation and agree with the radiologist interpretation.. ? ?  ? ?Consultants: ?No consultations were needed in caring for this patient. ? ?Treatment and Plan: ?Splint and outpatient ortho followup ? ?Emergency department workup does not suggest an emergent condition requiring admission or immediate intervention beyond  what has been performed at this time. The patient is safe for discharge and has  been instructed to return immediately for worsening symptoms, change in  symptoms or any other concerns ? ? ? ?Final Clinical Impressions(s) / ED Diagnoses  ? ?  ICD-10-CM   ?1. Hand sprain, left, initial encounter  S63.92XA   ?  ?  ?ED Discharge Orders   ? ? None  ? ?  ?  ? ? ?Discharge Instructions Discussed with and Provided to Patient:  ? ? ?Discharge Instructions   ? ?  ?Please follow-up with an orthopedic or a hand doctor in the next 2-3 days.  Wear the splint for the next week.  You can take ibuprofen and Tylenol for pain.  Use ice and elevation. ? ? ? ?  ?Montine Circle, PA-C ?05/06/21 0032 ? ?  ?Teressa Lower, MD ?05/06/21 [phone] ? ?

## 2021-05-06 NOTE — Discharge Instructions (Addendum)
Please follow-up with an orthopedic or a hand doctor in the next 2-3 days.  Wear the splint for the next week.  You can take ibuprofen and Tylenol for pain.  Use ice and elevation. ?

## 2021-05-06 NOTE — ED Notes (Signed)
E-signature pad unavailable at time of pt discharge. This RN discussed discharge materials with pt and answered all pt questions. Pt stated understanding of discharge material. ? ?

## 2022-06-30 IMAGING — CR DG HAND COMPLETE 3+V*L*
3 series · 3 of 3 positions shown · non-contrast
Comparison: None.

CLINICAL DATA: Left hand pain, fall.

EXAM:
LEFT HAND - COMPLETE 3+ VIEW

[hand pa]
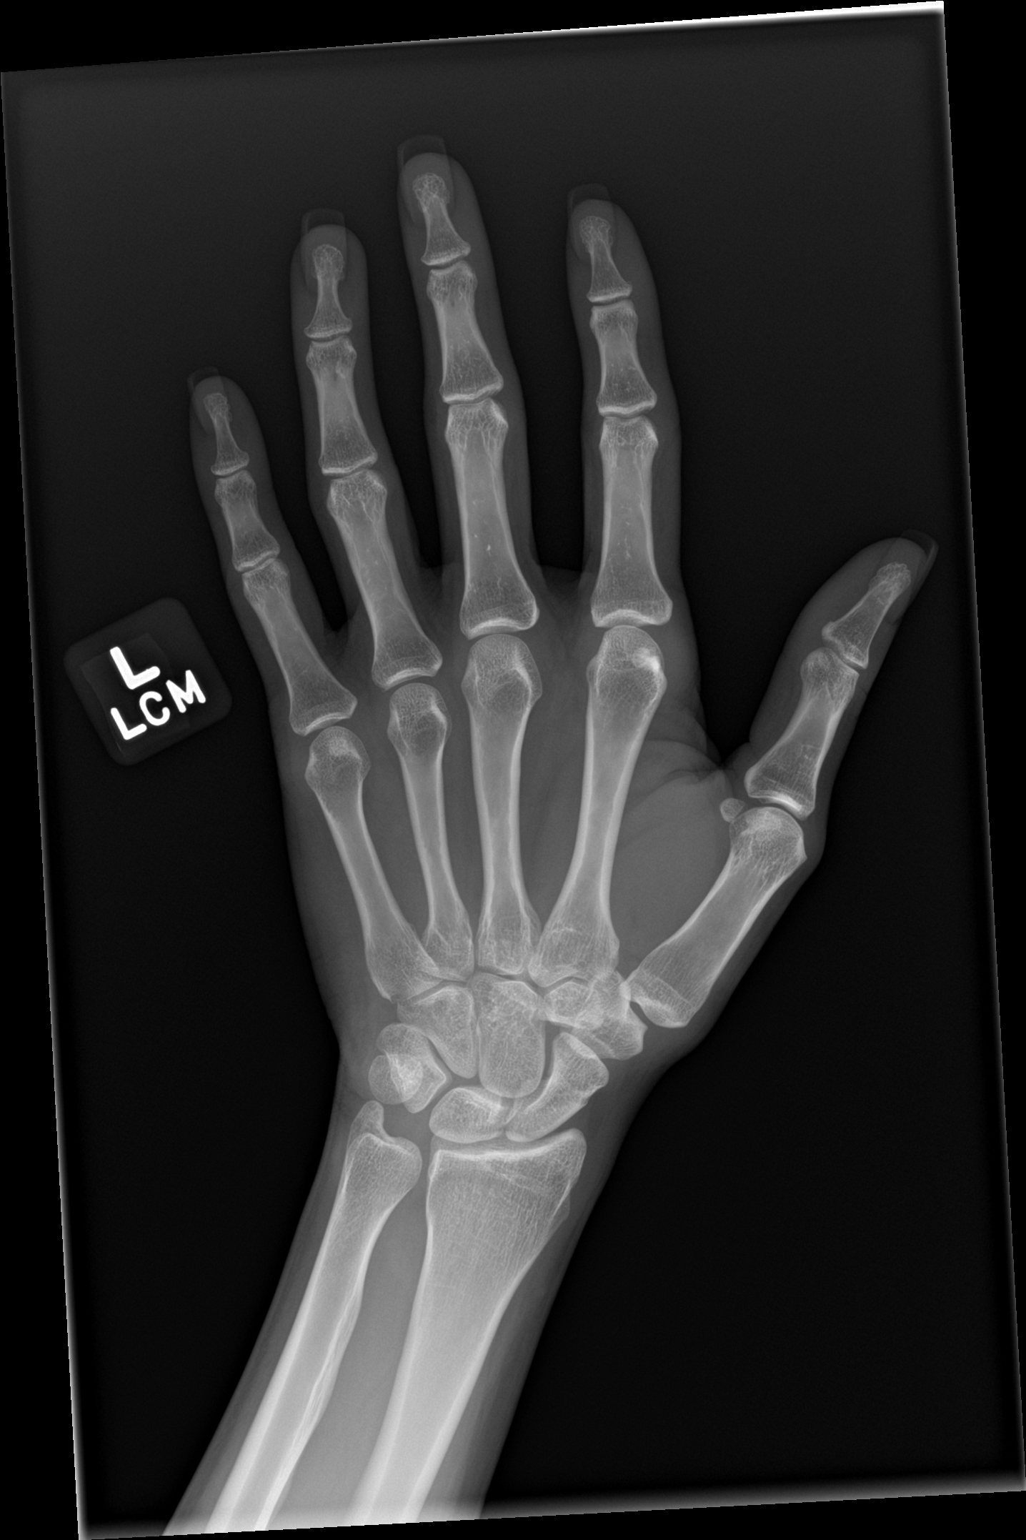

[hand obl]
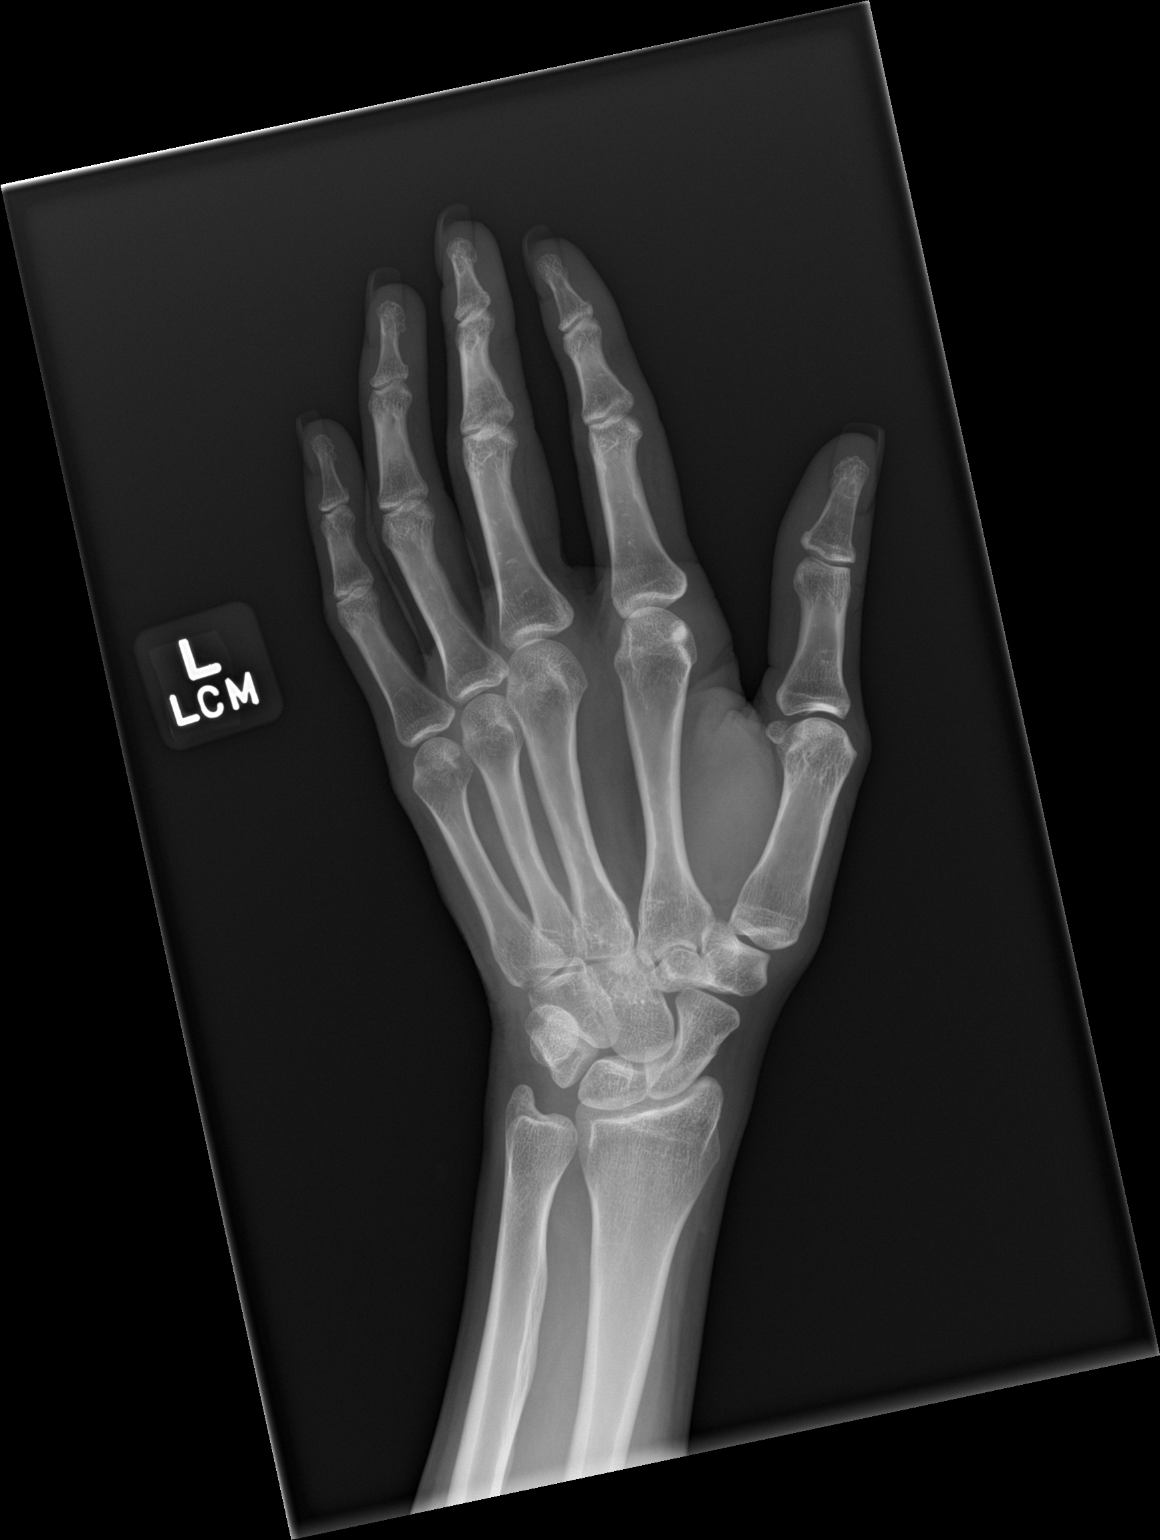

[hand lat]
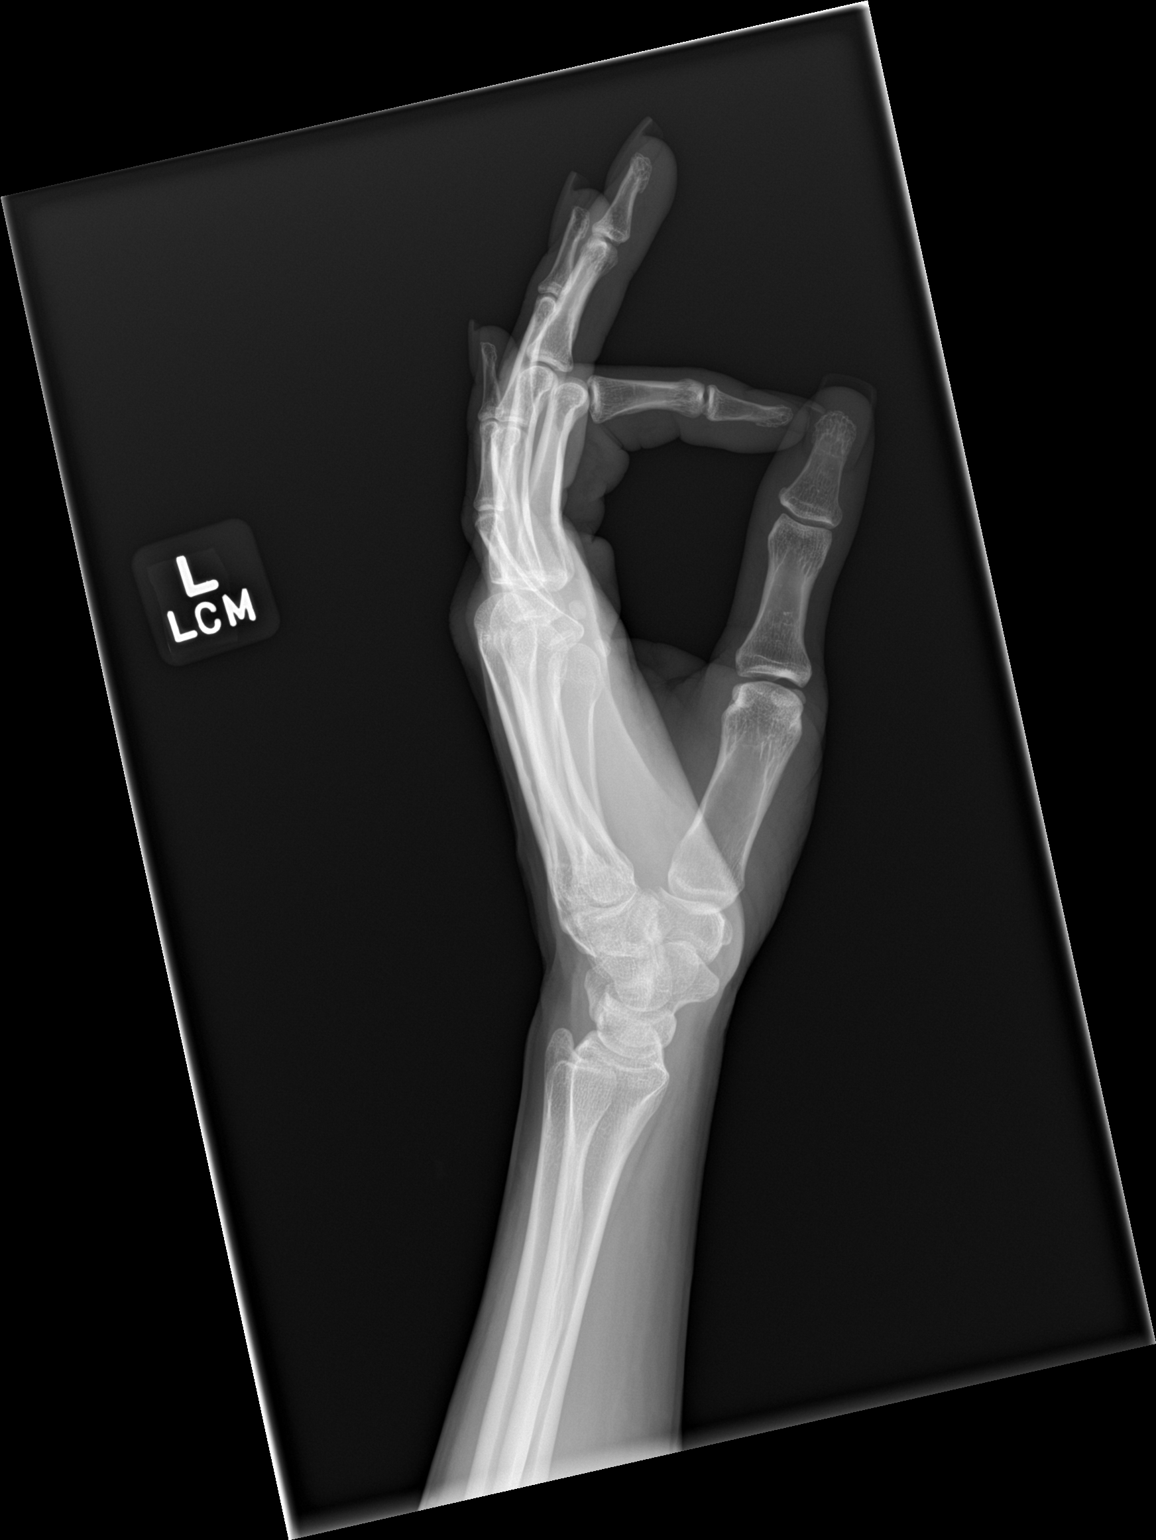

[3 of 3 positions shown; findings below may reference images not displayed]

FINDINGS: There is no evidence of fracture or dislocation. There is no
evidence of arthropathy or other focal bone abnormality. Soft
tissues are unremarkable.
IMPRESSION: Negative.
